# Patient Record
Sex: Male | Born: 1940
Health system: Southern US, Community
[De-identification: ages and names within clinical notes are randomized; demographics above are authoritative.]

## PROBLEM LIST (undated history)

## (undated) DIAGNOSIS — I10 Essential (primary) hypertension: Secondary | ICD-10-CM

## (undated) DIAGNOSIS — K219 Gastro-esophageal reflux disease without esophagitis: Secondary | ICD-10-CM

## (undated) DIAGNOSIS — C801 Malignant (primary) neoplasm, unspecified: Secondary | ICD-10-CM

## (undated) DIAGNOSIS — Z9289 Personal history of other medical treatment: Secondary | ICD-10-CM

## (undated) DIAGNOSIS — H269 Unspecified cataract: Secondary | ICD-10-CM

## (undated) DIAGNOSIS — T7840XA Allergy, unspecified, initial encounter: Secondary | ICD-10-CM

## (undated) DIAGNOSIS — E785 Hyperlipidemia, unspecified: Secondary | ICD-10-CM

## (undated) DIAGNOSIS — M1990 Unspecified osteoarthritis, unspecified site: Secondary | ICD-10-CM

## (undated) DIAGNOSIS — N400 Enlarged prostate without lower urinary tract symptoms: Secondary | ICD-10-CM

## (undated) HISTORY — DX: Unspecified osteoarthritis, unspecified site: M19.90

## (undated) HISTORY — DX: Benign prostatic hyperplasia without lower urinary tract symptoms: N40.0

## (undated) HISTORY — PX: EYE SURGERY: SHX253

## (undated) HISTORY — DX: Personal history of other medical treatment: Z92.89

## (undated) HISTORY — PX: OTHER SURGICAL HISTORY: SHX169

## (undated) HISTORY — DX: Hyperlipidemia, unspecified: E78.5

## (undated) HISTORY — DX: Allergy, unspecified, initial encounter: T78.40XA

## (undated) HISTORY — PX: TONSILLECTOMY: SUR1361

## (undated) HISTORY — PX: COLONOSCOPY: SHX174

## (undated) HISTORY — DX: Malignant (primary) neoplasm, unspecified: C80.1

## (undated) HISTORY — DX: Gastro-esophageal reflux disease without esophagitis: K21.9

## (undated) HISTORY — DX: Unspecified cataract: H26.9

## (undated) HISTORY — DX: Essential (primary) hypertension: I10

---

## 2004-08-01 ENCOUNTER — Ambulatory Visit: Payer: Self-pay

## 2007-04-04 ENCOUNTER — Ambulatory Visit: Payer: Self-pay | Admitting: Internal Medicine

## 2007-04-17 ENCOUNTER — Encounter: Payer: Self-pay | Admitting: Internal Medicine

## 2007-04-17 ENCOUNTER — Ambulatory Visit: Payer: Self-pay | Admitting: Internal Medicine

## 2007-04-17 DIAGNOSIS — Z860100 Personal history of colon polyps, unspecified: Secondary | ICD-10-CM | POA: Insufficient documentation

## 2007-07-02 ENCOUNTER — Encounter: Admission: RE | Admit: 2007-07-02 | Discharge: 2007-07-02 | Payer: Self-pay | Admitting: Family Medicine

## 2009-03-09 ENCOUNTER — Encounter: Payer: Self-pay | Admitting: Cardiology

## 2009-03-18 ENCOUNTER — Encounter: Payer: Self-pay | Admitting: Cardiology

## 2009-03-24 ENCOUNTER — Ambulatory Visit: Payer: Self-pay | Admitting: Cardiology

## 2009-03-24 DIAGNOSIS — E785 Hyperlipidemia, unspecified: Secondary | ICD-10-CM | POA: Insufficient documentation

## 2009-03-24 DIAGNOSIS — R0602 Shortness of breath: Secondary | ICD-10-CM | POA: Insufficient documentation

## 2009-04-20 ENCOUNTER — Ambulatory Visit: Payer: Self-pay | Admitting: Cardiology

## 2009-05-04 IMAGING — CR DG HIP W/ PELVIS BILAT
5 series · 5 of 5 positions shown · non-contrast
Comparison: None

CLINICAL DATA: Cough low back and bilateral hip pain

BILATERAL HIP WITH PELVIS

[view not recorded (1 of 5)]
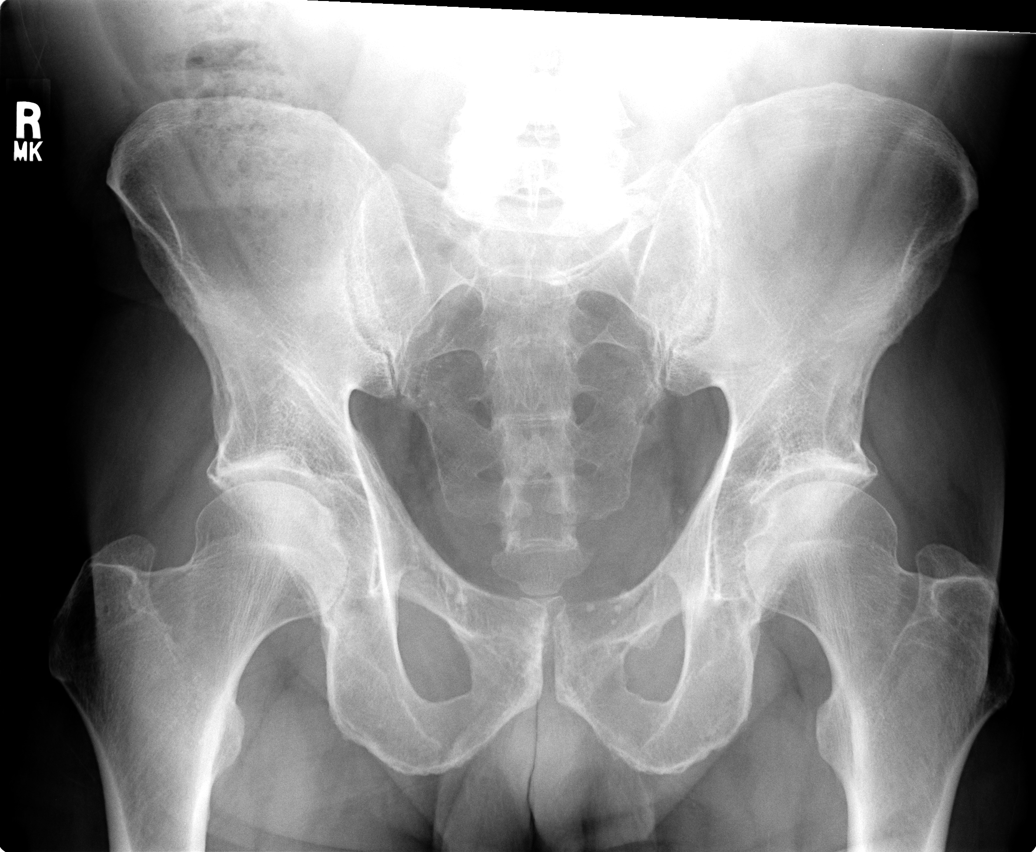

[view not recorded (2 of 5)]
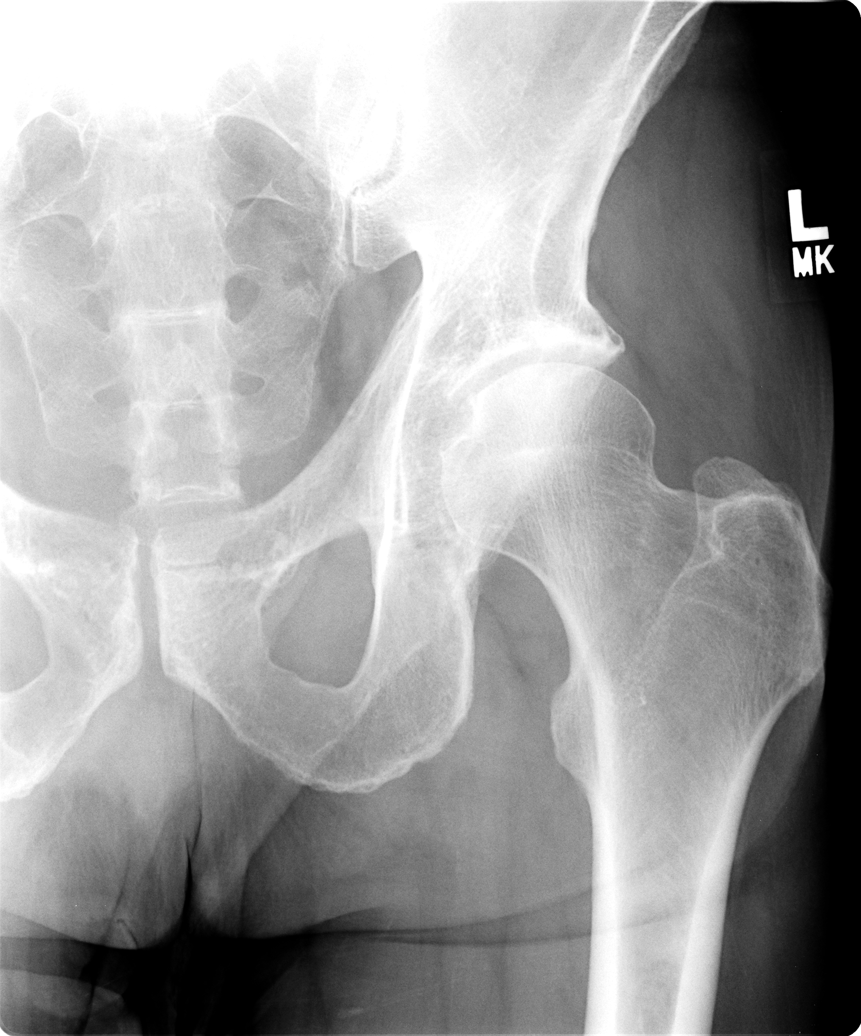

[view not recorded (3 of 5)]
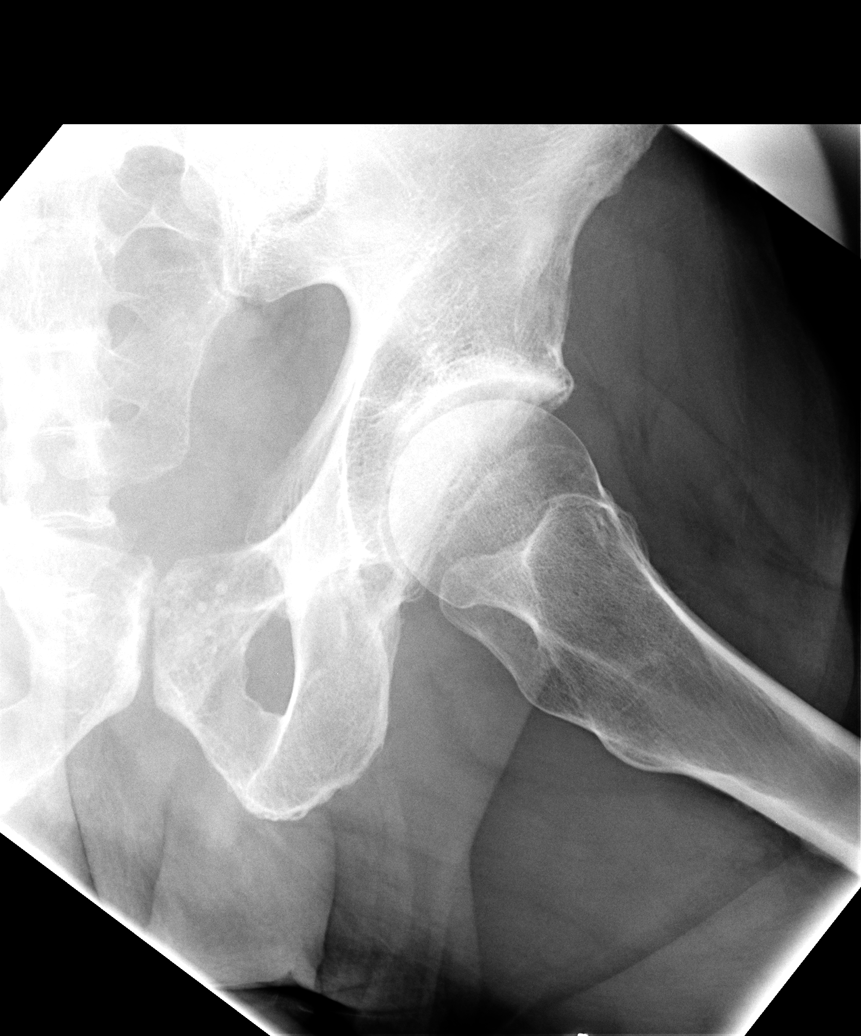

[view not recorded (4 of 5)]
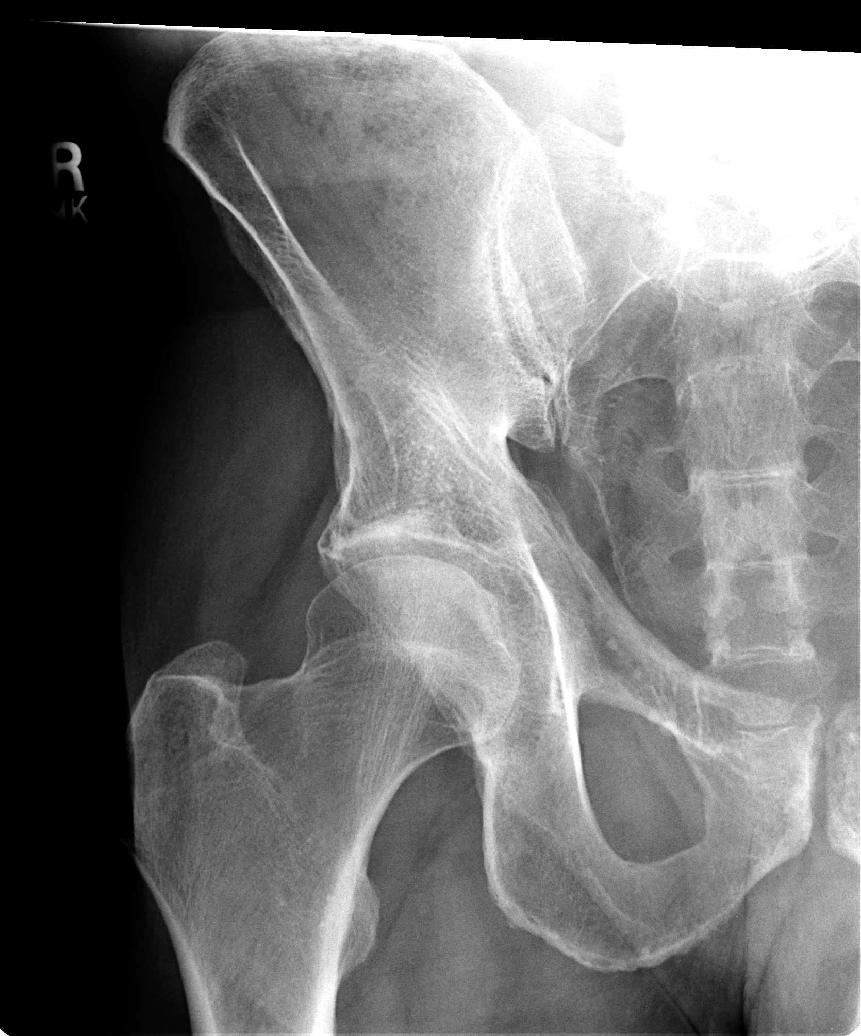

[view not recorded (5 of 5)]
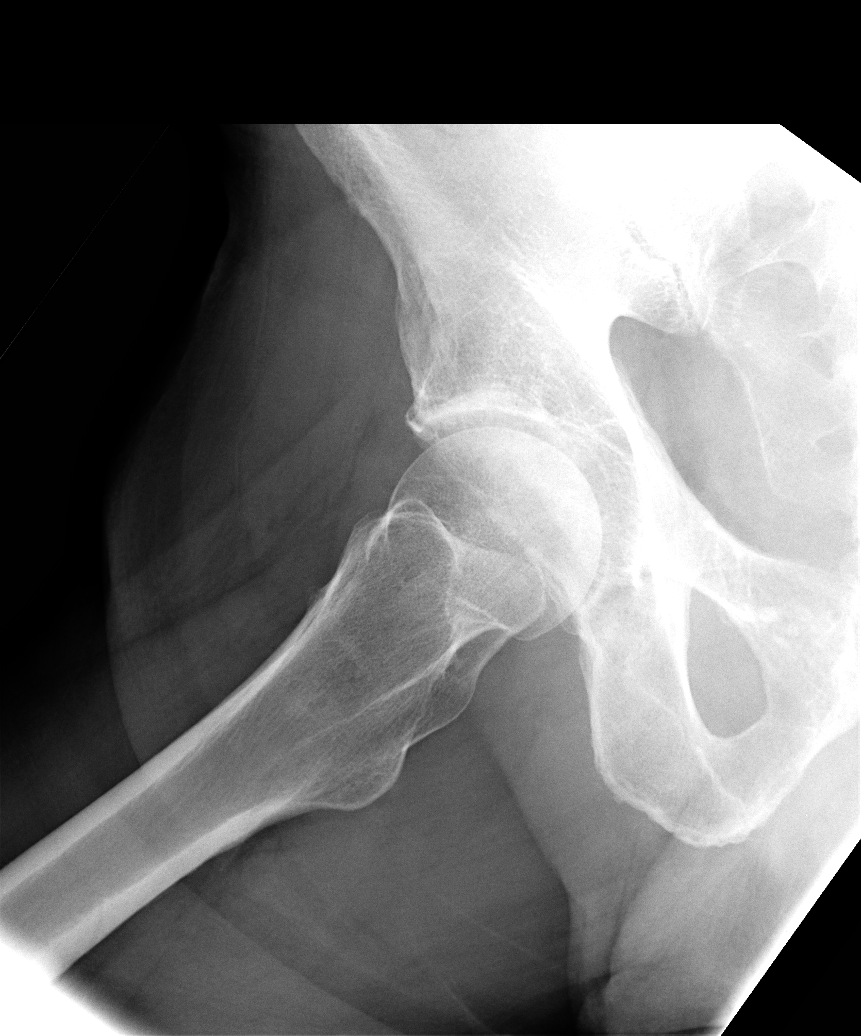

[5 of 5 positions shown; findings below may reference images not displayed]

FINDINGS: Mild degenerative changes of the hips bilaterally.
Normal mineralization.  There are no features of avascular necrosis
identified.  Degenerative disc disease lower lumbar spine.
IMPRESSION: Mild degenerative change about the hips bilaterally.

## 2009-05-04 IMAGING — CR DG CHEST 2V
2 series · 2 of 2 positions shown · non-contrast
Comparison: None

CLINICAL DATA: Cough, low back pain

CHEST - 2 VIEW

[view not recorded (1 of 2)]
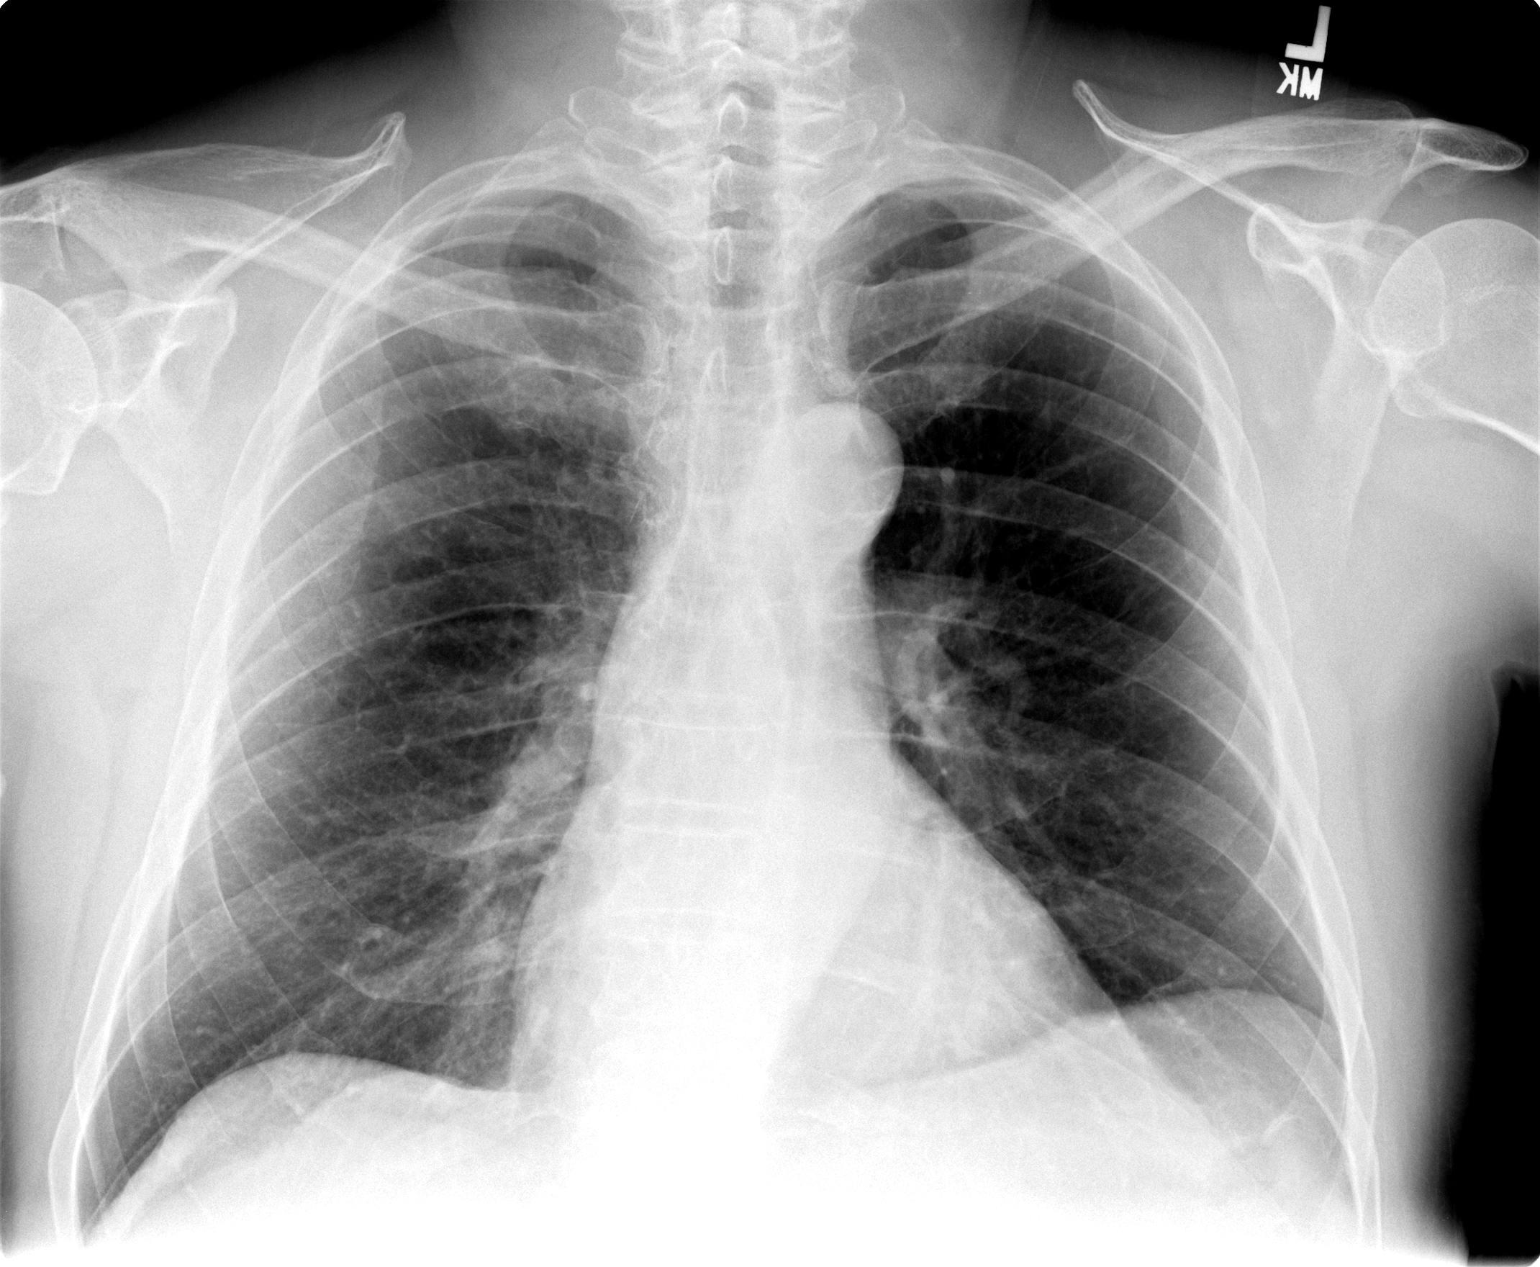

[view not recorded (2 of 2)]
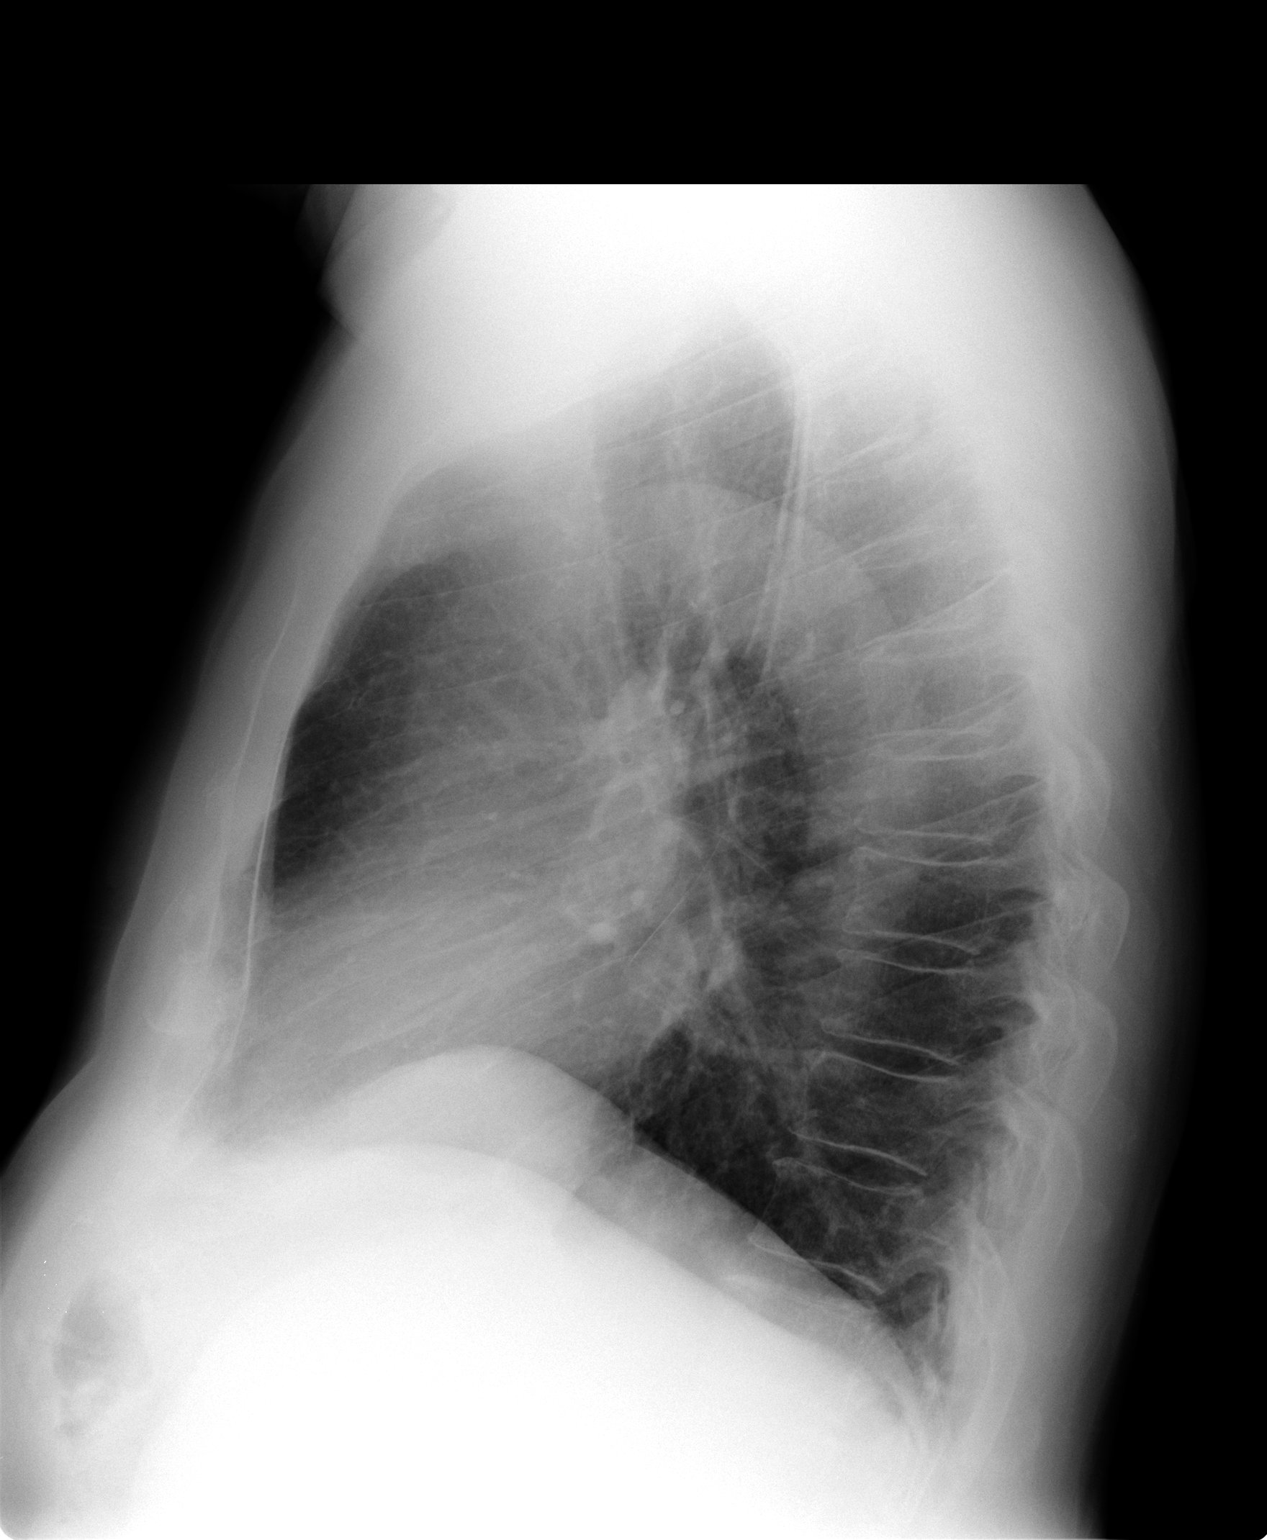

[2 of 2 positions shown; findings below may reference images not displayed]

FINDINGS: Mild peribronchial cuffing.  Negative for infiltrates or
effusions.  Heart is normal.  Mediastinum and hila are negative
adenopathy.  Mild thoracic spine degenerative changes
IMPRESSION: Bronchitis, acute versus chronic.

## 2010-02-21 NOTE — Assessment & Plan Note (Signed)
Summary: np6/family hx HD/risk factors/jss  Medications Added GLUCOSAMINE-CHONDROITIN  CAPS (GLUCOSAMINE-CHONDROIT-VIT C-MN) 2,000 mg daily MULTIVITAMINS  TABS (MULTIPLE VITAMIN) take one tablet once daily FISH OIL 1000 MG CAPS (OMEGA-3 FATTY ACIDS) two times a day COMPLETE ALLERGY 25 MG TABS (DIPHENHYDRAMINE HCL) once daily VITAMIN D3 1000 UNIT CAPS (CHOLECALCIFEROL) two times a day ASPIRIN 81 MG TABS (ASPIRIN) take one tablet once daily NIASPAN 500 MG CR-TABS (NIACIN (ANTIHYPERLIPIDEMIC)) take 1500 daily LIPITOR 40 MG TABS (ATORVASTATIN CALCIUM) take one tablet once daily ALEVE 220 MG TABS (NAPROXEN SODIUM) take as needed      Allergies Added: ! PREDNISONE  Primary Provider:  Dr. Christell Constant   History of Present Illness: 70 yo with history of hyperlipidemia and strong family history of CAD presents for cardiology evaluation.  Patient has been doing well.  He is still working as a Curator.  He has had no episodes of chest pain or exertional dyspnea.  He is active at work.  No episodes of syncope or palpitations.  Patient quit smoking in 1969.  His lipids are under good control and his BP is within normal range.  Patient's father and two brothers all had heart attacks.  His two brothers had CABG.    ECG: NSR, probably insignificant Qs in leads I and AVL.   Labs (2/11): creatinine 1.08, TSH normal, LDL 63, HDL 49  Current Medications (verified): 1)  Glucosamine-Chondroitin  Caps (Glucosamine-Chondroit-Vit C-Mn) .... 2,000 Mg Daily 2)  Multivitamins  Tabs (Multiple Vitamin) .... Take One Tablet Once Daily 3)  Fish Oil 1000 Mg Caps (Omega-3 Fatty Acids) .... Two Times A Day 4)  Complete Allergy 25 Mg Tabs (Diphenhydramine Hcl) .... Once Daily 5)  Vitamin D3 1000 Unit Caps (Cholecalciferol) .... Two Times A Day 6)  Aspirin 81 Mg Tabs (Aspirin) .... Take One Tablet Once Daily 7)  Niaspan 500 Mg Cr-Tabs (Niacin (Antihyperlipidemic)) .... Take 1500 Daily 8)  Lipitor 40 Mg Tabs (Atorvastatin  Calcium) .... Take One Tablet Once Daily 9)  Aleve 220 Mg Tabs (Naproxen Sodium) .... Take As Needed  Allergies (verified): 1)  ! Prednisone  Past History:  Past Medical History: 1.  Hyperlipidemia 2. BPH 3. ETT several years ago with Dr. Elease Hashimoto was normal per patient  Family History: 2 brothers with CAD.  One had CABG at 43 and other with CABG in his 47s. Father with MI in his 16s.   Social History: Quit tobacco in 1969.  Still working as a Curator.  LIves in [place].  Married with 2 children.   Review of Systems       All systems reviewed and negative except as per HPI.   Vital Signs:  Patient profile:   70 year old male Height:      70 inches Weight:      196 pounds BMI:     28.22 Pulse rate:   68 / minute Pulse rhythm:   regular BP sitting:   128 / 80  (left arm) Cuff size:   regular  Vitals Entered By: Judithe Modest CMA (March 24, 2009 3:48 PM)  Physical Exam  General:  Well developed, well nourished, in no acute distress. Head:  normocephalic and atraumatic Nose:  no deformity, discharge, inflammation, or lesions Mouth:  Teeth, gums and palate normal. Oral mucosa normal. Neck:  Neck supple, no JVD. No masses, thyromegaly or abnormal cervical nodes. Lungs:  Clear bilaterally to auscultation and percussion. Heart:  Non-displaced PMI, chest non-tender; regular rate and rhythm, S1, S2 without  murmurs, rubs or gallops. Carotid upstroke normal, no bruit. Pedals normal pulses. No edema, no varicosities. Abdomen:  Bowel sounds positive; abdomen soft and non-tender without masses, organomegaly, or hernias noted. No hepatosplenomegaly. Msk:  Back normal, normal gait. Muscle strength and tone normal. Extremities:  No clubbing or cyanosis. Neurologic:  Alert and oriented x 3. Skin:  Intact without lesions or rashes. Psych:  Normal affect.   Impression & Recommendations:  Problem # 1:  CARDIAC RISK ASSESSMENT Patient is doing well without chest pain or  exertional dyspnea.  His cardiac risk factors are hyperlipidemia and family history of CAD.  I will set him up for an ETT for risk stratification.    Problem # 2:  HYPERLIPIDEMIA-MIXED (ICD-272.4) Lipids are well-controlled by Dr. Christell Constant.   Other Orders: Treadmill (Treadmill)  Patient Instructions: 1)  Your physician has requested that you have an exercise tolerance test.  For further information please visit [email or web address]  2)  Wednesday March 30,2011 3PM--this will be at [facility] [employer]- [address]  3)  DO NOT eat , drink, or use tobacco products 4 hours before the test. 4)  DO  NOT  use caffeine for 12 hours before the test. 5)  You may take all your medications before the test.  6)  Wear comfortable shoes and clothes for walking. 7)  Call [phone] if you need to cancel or reschedule the appointment.

## 2010-02-21 NOTE — Consult Note (Signed)
Summary: [facility]  [facility]   Imported By: Marylou Mccoy 05/12/2009 13:18:46  _____________________________________________________________________  External Attachment:    Type:   Image     Comment:   External Document

## 2010-09-05 ENCOUNTER — Encounter: Payer: Self-pay | Admitting: Cardiology

## 2012-03-09 ENCOUNTER — Encounter: Payer: Self-pay | Admitting: Internal Medicine

## 2012-03-10 ENCOUNTER — Encounter: Payer: Self-pay | Admitting: Internal Medicine

## 2012-03-20 ENCOUNTER — Encounter: Payer: Self-pay | Admitting: Internal Medicine

## 2012-04-01 ENCOUNTER — Ambulatory Visit (AMBULATORY_SURGERY_CENTER): Payer: Medicare Other | Admitting: *Deleted

## 2012-04-01 VITALS — Ht 71.0 in | Wt 210.2 lb

## 2012-04-01 DIAGNOSIS — Z86010 Personal history of colonic polyps: Secondary | ICD-10-CM

## 2012-04-01 DIAGNOSIS — Z1211 Encounter for screening for malignant neoplasm of colon: Secondary | ICD-10-CM

## 2012-04-01 NOTE — Progress Notes (Signed)
No soy or egg allergy Suprep sample given to pt

## 2012-04-04 ENCOUNTER — Encounter: Payer: Self-pay | Admitting: Internal Medicine

## 2012-04-28 ENCOUNTER — Telehealth: Payer: Self-pay | Admitting: Internal Medicine

## 2012-04-28 NOTE — Telephone Encounter (Signed)
Patient wanted to know if he can take his usual laxatives prior to prepping for the procedure.  He takes an aloe vera pill daily.  He is advised that he is fine to take that.  He will call back for any additional questions.

## 2012-04-30 ENCOUNTER — Ambulatory Visit (AMBULATORY_SURGERY_CENTER): Payer: Medicare Other | Admitting: Internal Medicine

## 2012-04-30 ENCOUNTER — Encounter: Payer: Self-pay | Admitting: Internal Medicine

## 2012-04-30 VITALS — BP 130/84 | HR 63 | Temp 97.4°F | Resp 15 | Ht 71.0 in | Wt 210.0 lb

## 2012-04-30 DIAGNOSIS — Z86010 Personal history of colonic polyps: Secondary | ICD-10-CM

## 2012-04-30 DIAGNOSIS — Z1211 Encounter for screening for malignant neoplasm of colon: Secondary | ICD-10-CM

## 2012-04-30 DIAGNOSIS — D126 Benign neoplasm of colon, unspecified: Secondary | ICD-10-CM

## 2012-04-30 MED ORDER — SODIUM CHLORIDE 0.9 % IV SOLN: 500.0000 mL | INTRAVENOUS | Status: DC

## 2012-04-30 NOTE — Patient Instructions (Addendum)
Three tiny polyps were removed. The colon is stained "melanosis" as before. Usually from natural laxative use and not a problem. Your prostate is enlarged - I believe you know that - be sure to keep regular follow-up on that with your primary provider and/or urologist.  I will let you know pathology results and when to have another routine colonoscopy by mail.  Thank you for choosing me and [facility].  Iva Boop, MD, FACG  YOU HAD AN ENDOSCOPIC PROCEDURE TODAY AT THE [facility]: Refer to the procedure report that was given to you for any specific questions about what was found during the examination.  If the procedure report does not answer your questions, please call your gastroenterologist to clarify.  If you requested that your care partner not be given the details of your procedure findings, then the procedure report has been included in a sealed envelope for you to review at your convenience later.  YOU SHOULD EXPECT: Some feelings of bloating in the abdomen. Passage of more gas than usual.  Walking can help get rid of the air that was put into your GI tract during the procedure and reduce the bloating. If you had a lower endoscopy (such as a colonoscopy or flexible sigmoidoscopy) you may notice spotting of blood in your stool or on the toilet paper. If you underwent a bowel prep for your procedure, then you may not have a normal bowel movement for a few days.  DIET: Your first meal following the procedure should be a light meal and then it is ok to progress to your normal diet.  A half-sandwich or bowl of soup is an example of a good first meal.  Heavy or fried foods are harder to digest and may make you feel nauseous or bloated.  Likewise meals heavy in dairy and vegetables can cause extra gas to form and this can also increase the bloating.  Drink plenty of fluids but you should avoid alcoholic beverages for 24 hours.  ACTIVITY: Your care partner should take  you home directly after the procedure.  You should plan to take it easy, moving slowly for the rest of the day.  You can resume normal activity the day after the procedure however you should NOT DRIVE or use heavy machinery for 24 hours (because of the sedation medicines used during the test).    SYMPTOMS TO REPORT IMMEDIATELY: A gastroenterologist can be reached at any hour.  During normal business hours, 8:30 AM to 5:00 PM Monday through Friday, call [phone].  After hours and on weekends, please call the GI answering service at [phone] who will take a message and have the physician on call contact you.   Following lower endoscopy (colonoscopy or flexible sigmoidoscopy):  Excessive amounts of blood in the stool  Significant tenderness or worsening of abdominal pains  Swelling of the abdomen that is new, acute  Fever of 100F or higher  FOLLOW UP: If any biopsies were taken you will be contacted by phone or by letter within the next 1-3 weeks.  Call your gastroenterologist if you have not heard about the biopsies in 3 weeks.  Our staff will call the home number listed on your records the next business day following your procedure to check on you and address any questions or concerns that you may have at that time regarding the information given to you following your procedure. This is a courtesy call and so if there is no answer at the  home number and we have not heard from you through the emergency physician on call, we will assume that you have returned to your regular daily activities without incident.  SIGNATURES/CONFIDENTIALITY: You and/or your care partner have signed paperwork which will be entered into your electronic medical record.  These signatures attest to the fact that that the information above on your After Visit Summary has been reviewed and is understood.  Full responsibility of the confidentiality of this discharge information lies with you and/or your  care-partner.  Polyps-handout given  Repeat colonoscopy will be determined by by pathology

## 2012-04-30 NOTE — Progress Notes (Signed)
Lidocaine-40mg IV prior to Propofol InductionPropofol given over incremental dosages 

## 2012-04-30 NOTE — Op Note (Signed)
[facility] [address] [place], [address]   COLONOSCOPY PROCEDURE REPORT  PATIENT: Michael Nguyen, Michael Nguyen  MR#: [id number] BIRTHDATE: 06-23-1940 , 71  yrs. old GENDER: Male ENDOSCOPIST: Iva Boop, MD, [facility] PROCEDURE DATE:  04/30/2012 PROCEDURE:   Colonoscopy with biopsy ASA CLASS:   Class II INDICATIONS:Screening and surveillance,personal history of colonic polyps. MEDICATIONS: propofol (Diprivan) 200mg  IV, MAC sedation, administered by CRNA, and These medications were titrated to patient response per physician's verbal order  DESCRIPTION OF PROCEDURE:   After the risks benefits and alternatives of the procedure were thoroughly explained, informed consent was obtained.  A digital rectal exam revealed an enlarged prostate.   The LB [id number]  endoscope was introduced through the anus and advanced to the cecum, which was identified by both the appendix and ileocecal valve. No adverse events experienced.   The quality of the prep was Suprep excellent  The instrument was then slowly withdrawn as the colon was fully examined.      COLON FINDINGS: Two diminutive polypoid shaped sessile polyps were found in the ascending colon.  A polypectomy was performed with cold forceps.  The resection was complete and the polyp tissue was completely retrieved.   A diminutive polypoid shaped sessile polyp was found in the sigmoid colon.  A polypectomy was performed with cold forceps.  The resection was complete and the polyp tissue was completely retrieved.   Severe melanosis was found throughout the entire examined colon.   The colon mucosa was otherwise normal.   A right colon retroflexion was performed.  Retroflexed views revealed no abnormalities. The time to cecum=2 minutes 52 seconds. Withdrawal time=12 minutes 17 seconds.  The scope was withdrawn and the procedure completed. COMPLICATIONS: There were no complications.  ENDOSCOPIC IMPRESSION: 1.   Two  diminutive sessile polyps were found in the ascending colon; polypectomy was performed with cold forceps 2.   Diminutive sessile polyp was found in the sigmoid colon; polypectomy was performed with cold forceps 3.   Severe melanosis was found throughout the entire examined colon  4.   The colon mucosa was otherwise normal  RECOMMENDATIONS: Timing of repeat colonoscopy will be determined by pathology findings in patient w/ hx 2 diminutive adenomas 2009   eSigned:  Iva Boop, MD, [facility] 04/30/2012 9:04 AM   cc: Lynnea Ferrier, MD and The Patient

## 2012-04-30 NOTE — Progress Notes (Signed)
Patient did not experience any of the following events: a burn prior to discharge; a fall within the facility; wrong site/side/patient/procedure/implant event; or a hospital transfer or hospital admission upon discharge from the facility. (G8907) Patient did not have preoperative order for IV antibiotic SSI prophylaxis. (G8918)  

## 2012-05-01 ENCOUNTER — Telehealth: Payer: Self-pay | Admitting: *Deleted

## 2012-05-01 NOTE — Telephone Encounter (Signed)
  Follow up Call-  Call back number 04/30/2012  Post procedure Call Back phone  # [phone]  Permission to leave phone message Yes     Patient questions:  Do you have a fever, pain , or abdominal swelling? no Pain Score  0 *  Have you tolerated food without any problems? yes  Have you been able to return to your normal activities? yes  Do you have any questions about your discharge instructions: Diet   no Medications  no Follow up visit  no  Do you have questions or concerns about your Care? no  Actions: * If pain score is 4 or above: No action needed, pain <4.

## 2012-05-05 ENCOUNTER — Encounter: Payer: Self-pay | Admitting: Internal Medicine

## 2012-05-05 NOTE — Progress Notes (Signed)
Quick Note:  3 diminutive adenomas Repeat colonoscopy 5 yrs about 04/2017 ______

## 2012-09-02 ENCOUNTER — Other Ambulatory Visit: Payer: Medicare Other

## 2012-09-02 DIAGNOSIS — E785 Hyperlipidemia, unspecified: Secondary | ICD-10-CM

## 2012-09-02 DIAGNOSIS — Z79899 Other long term (current) drug therapy: Secondary | ICD-10-CM

## 2012-09-02 LAB — LIPID PANEL
LDL Cholesterol: 64 mg/dL (ref 0–99)
VLDL: 24 mg/dL (ref 0–40)

## 2012-09-02 LAB — COMPLETE METABOLIC PANEL WITH GFR
ALT: 27 U/L (ref 0–53)
AST: 24 U/L (ref 0–37)
Albumin: 4.1 g/dL (ref 3.5–5.2)
Calcium: 9.3 mg/dL (ref 8.4–10.5)
Chloride: 107 mEq/L (ref 96–112)
Potassium: 4.2 mEq/L (ref 3.5–5.3)
Total Protein: 6.4 g/dL (ref 6.0–8.3)

## 2012-09-11 ENCOUNTER — Ambulatory Visit (INDEPENDENT_AMBULATORY_CARE_PROVIDER_SITE_OTHER): Payer: Medicare Other | Admitting: Family Medicine

## 2012-09-11 ENCOUNTER — Encounter: Payer: Self-pay | Admitting: Family Medicine

## 2012-09-11 VITALS — BP 130/80 | HR 80 | Temp 97.8°F | Resp 16 | Wt 210.0 lb

## 2012-09-11 DIAGNOSIS — E785 Hyperlipidemia, unspecified: Secondary | ICD-10-CM

## 2012-09-11 DIAGNOSIS — N400 Enlarged prostate without lower urinary tract symptoms: Secondary | ICD-10-CM

## 2012-09-11 DIAGNOSIS — N182 Chronic kidney disease, stage 2 (mild): Secondary | ICD-10-CM

## 2012-09-11 MED ORDER — TAMSULOSIN HCL 0.4 MG PO CAPS: 0.4000 mg | ORAL_CAPSULE | Freq: Every day | ORAL | Status: DC

## 2012-09-11 NOTE — Progress Notes (Signed)
Subjective:    Patient ID: Michael Nguyen, male    DOB: [birth year], 72 y.o.   MRN: [id number]  HPI  Patient is here today for followup of his hyperlipidemia. He is currently taking Lipitor 40 mg by mouth daily and fish oil 2000 mg by mouth daily. He denies any myalgias or right upper quadrant pain. He denies any chest pain, shortness of breath, dyspnea on exertion.  Overall he is doing well without concerns. He does report 2 episodes of nocturia each night. He also reports weak urinary stream and lower uterine output. It is worse at night but also occurs during the day. At his physical in February he was found to have a +1 prostate without nodularity. Past Medical History  Diagnosis Date  . Hyperlipidemia   . BPH (benign prostatic hypertrophy)   . History of ETT     with dr. Elease Hashimoto was normal per patient  . Allergy   . Arthritis     "some" per pt  . Cancer     skin- basal and squamous cell CA   Current Outpatient Prescriptions on File Prior to Visit  Medication Sig Dispense Refill  . ALOE VERA PO Take by mouth daily.      . Ascorbic Acid (VITAMIN C PO) Take by mouth daily.      Marland Kitchen aspirin 81 MG tablet Take 81 mg by mouth daily.        Marland Kitchen atorvastatin (LIPITOR) 40 MG tablet Take 40 mg by mouth daily.        . Cholecalciferol (VITAMIN D3) 1000 UNITS CAPS Take 1 capsule by mouth 2 (two) times daily.        . fish oil-omega-3 fatty acids 1000 MG capsule Take 1 g by mouth 2 (two) times daily.        Marland Kitchen GLUCOSAMINE-CHONDROITIN-VIT C PO Take 2,000 mg by mouth daily.        . Multiple Vitamin (MULTIVITAMIN) tablet Take 1 tablet by mouth daily.        . naproxen sodium (ALEVE) 220 MG tablet Take 220 mg by mouth as needed.         No current facility-administered medications on file prior to visit.   Allergies  Allergen Reactions  . Prednisone     REACTION: ? intolerance per patient / Cramps   History   Social History  . Marital Status: Married    Spouse Name: N/A    Number of Children:  2  . Years of Education: N/A   Occupational History  . Mechanic    Social History Main Topics  . Smoking status: Former Smoker    Quit date: 01/23/1967  . Smokeless tobacco: Never Used  . Alcohol Use: No  . Drug Use: No  . Sexual Activity: Not on file   Other Topics Concern  . Not on file   Social History Narrative   Lives in [place]     Review of Systems  All other systems reviewed and are negative.       Objective:   Physical Exam  Vitals reviewed. Neck: Neck supple. No JVD present. No thyromegaly present.  Cardiovascular: Normal rate, regular rhythm and normal heart sounds.  Exam reveals no gallop and no friction rub.   No murmur heard. Pulmonary/Chest: Effort normal and breath sounds normal. No respiratory distress. He has no wheezes. He has no rales.  Abdominal: Soft. Bowel sounds are normal. He exhibits no distension. There is no tenderness. There is no rebound and  no guarding.  Lymphadenopathy:    He has no cervical adenopathy.   Lab on 09/02/2012  Component Date Value Range Status  . Cholesterol 09/02/2012 125  0 - 200 mg/dL Final   Comment: ATP III Classification:                                < 200        mg/dL        Desirable                               200 - 239     mg/dL        Borderline High                               >= 240        mg/dL        High                             . Triglycerides 09/02/2012 122  <150 mg/dL Final  . HDL 16/10/9602 37* >39 mg/dL Final  . Total CHOL/HDL Ratio 09/02/2012 3.4   Final  . VLDL 09/02/2012 24  0 - 40 mg/dL Final  . LDL Cholesterol 09/02/2012 64  0 - 99 mg/dL Final   Comment:                            Total Cholesterol/HDL Ratio:CHD Risk                                                 Coronary Heart Disease Risk Table                                                                 Men       Women                                   1/2 Average Risk              3.4        3.3                                        Average Risk              5.0        4.4                                    2X Average Risk              9.6  7.1                                    3X Average Risk             23.4       11.0                          Use the calculated Patient Ratio above and the CHD Risk table                           to determine the patient's CHD Risk.                          ATP III Classification (LDL):                                < 100        mg/dL         Optimal                               100 - 129     mg/dL         Near or Above Optimal                               130 - 159     mg/dL         Borderline High                               160 - 189     mg/dL         High                                > 190        mg/dL         Very High                             . Sodium 09/02/2012 141  135 - 145 mEq/L Final  . Potassium 09/02/2012 4.2  3.5 - 5.3 mEq/L Final  . Chloride 09/02/2012 107  96 - 112 mEq/L Final  . CO2 09/02/2012 26  19 - 32 mEq/L Final  . Glucose, Bld 09/02/2012 96  70 - 99 mg/dL Final  . BUN 40/98/1191 15  6 - 23 mg/dL Final  . Creat 47/82/9562 1.21  0.50 - 1.35 mg/dL Final  . Total Bilirubin 09/02/2012 0.9  0.3 - 1.2 mg/dL Final  . Alkaline Phosphatase 09/02/2012 44  39 - 117 U/L Final  . AST 09/02/2012 24  0 - 37 U/L Final  . ALT 09/02/2012 27  0 - 53 U/L Final  . Total Protein 09/02/2012 6.4  6.0 - 8.3 g/dL Final  . Albumin 13/08/6576 4.1  3.5 - 5.2 g/dL Final  . Calcium 46/96/2952 9.3  8.4 - 10.5 mg/dL Final  . GFR, Est African American 09/02/2012 69   Final  .  GFR, Est Non African American 09/02/2012 60   Final   Comment:                            The estimated GFR is a calculation valid for adults (>=38 years old)                          that uses the CKD-EPI algorithm to adjust for age and sex. It is                            not to be used for children, pregnant women, hospitalized patients,                             patients on  dialysis, or with rapidly changing kidney function.                          According to the NKDEP, eGFR >89 is normal, 60-89 shows mild                          impairment, 30-59 shows moderate impairment, 15-29 shows severe                          impairment and <15 is ESRD.                                      Assessment & Plan:  1. HYPERLIPIDEMIA-MIXED Labs look excellent except for a slightly low HDL. I have recommended he continue the fish oil and Lipitor.  Recheck levels in 6 months. I also recommended he increase his aerobic exercise to try to help address his low HDL.  2. CKD (chronic kidney disease), stage 2 (mild) Patient is taking Aleve daily for low back pain. He has a mildly elevated creatinine. I recommended that he decrease the amount of Aleve that he is taking and replace it with Tylenol.  3. BPH (benign prostatic hyperplasia) Begin Flomax 0.4 mg by mouth each bedtime and recheck in 1 month.

## 2012-10-14 ENCOUNTER — Telehealth: Payer: Self-pay | Admitting: Family Medicine

## 2012-10-14 NOTE — Telephone Encounter (Signed)
Pt has been on Flomax and he says that it is working other than he feels his head is constantly stopped up. He says that he has to keep an inhaler with him always because of this.

## 2012-10-15 ENCOUNTER — Ambulatory Visit (INDEPENDENT_AMBULATORY_CARE_PROVIDER_SITE_OTHER): Payer: Medicare Other | Admitting: Family Medicine

## 2012-10-15 DIAGNOSIS — Z23 Encounter for immunization: Secondary | ICD-10-CM

## 2012-10-16 NOTE — Telephone Encounter (Signed)
LMTRC with pt.

## 2012-10-16 NOTE — Telephone Encounter (Signed)
Try stopping flomax and see if symptoms improve.  If not, NTBS.

## 2012-10-17 NOTE — Telephone Encounter (Signed)
LMTRC with pt.

## 2012-10-20 NOTE — Telephone Encounter (Signed)
Pt still has called back.

## 2012-10-27 ENCOUNTER — Ambulatory Visit (INDEPENDENT_AMBULATORY_CARE_PROVIDER_SITE_OTHER): Payer: Medicare Other | Admitting: Family Medicine

## 2012-10-27 ENCOUNTER — Encounter: Payer: Self-pay | Admitting: Family Medicine

## 2012-10-27 DIAGNOSIS — I872 Venous insufficiency (chronic) (peripheral): Secondary | ICD-10-CM

## 2012-10-27 DIAGNOSIS — T485X1A Poisoning by other anti-common-cold drugs, accidental (unintentional), initial encounter: Secondary | ICD-10-CM

## 2012-10-27 DIAGNOSIS — T48201A Poisoning by unspecified drugs acting on muscles, accidental (unintentional), initial encounter: Secondary | ICD-10-CM

## 2012-10-27 MED ORDER — FLUTICASONE PROPIONATE 50 MCG/ACT NA SUSP: 2.0000 | Freq: Every day | NASAL | Status: DC

## 2012-10-27 NOTE — Progress Notes (Signed)
Subjective:    Patient ID: Michael Nguyen, male    DOB: 1940-03-22, 72 y.o.   MRN: [id number]  HPI Patient presents with episodic swelling in both legs. He states at the end of the day he has pitting edema in his ankles and lower legs.  However after he sleeps the night the swelling goes down.  He denies any chest pain, orthopnea, paroxysmal nocturnal dyspnea, pleurisy, or cough. His weight is stable. We recently checked a CMP that was negative for any renal or hepatic problems. He has no history of blood clots. He denies any pain in the legs. He does have numerous spider veins around the ankles and large varicose veins on his shins significant for chronic venous insufficiency.  He also has been using Afrin every day for over a month. He states that his nose still like it is completely swollen shut. He is having a difficult time breathing. He denies any fever or nasal pain or sinus pain. Past Medical History  Diagnosis Date  . Hyperlipidemia   . BPH (benign prostatic hypertrophy)   . History of ETT     with dr. Elease Hashimoto was normal per patient  . Allergy   . Arthritis     "some" per pt  . Cancer     skin- basal and squamous cell CA   Past Surgical History  Procedure Laterality Date  . Cataracts      both eyes  . Colonoscopy    . Tonsillectomy     Current Outpatient Prescriptions on File Prior to Visit  Medication Sig Dispense Refill  . ALOE VERA PO Take by mouth daily.      . Ascorbic Acid (VITAMIN C PO) Take by mouth daily.      Marland Kitchen aspirin 81 MG tablet Take 81 mg by mouth daily.        Marland Kitchen atorvastatin (LIPITOR) 40 MG tablet Take 40 mg by mouth daily.        . cetirizine (ZYRTEC) 10 MG tablet Take 10 mg by mouth daily.      . Cholecalciferol (VITAMIN D3) 1000 UNITS CAPS Take 1 capsule by mouth 2 (two) times daily.        . fish oil-omega-3 fatty acids 1000 MG capsule Take 1 g by mouth 2 (two) times daily.        Marland Kitchen GLUCOSAMINE-CHONDROITIN-VIT C PO Take 2,000 mg by mouth daily.        .  Multiple Vitamin (MULTIVITAMIN) tablet Take 1 tablet by mouth daily.        . naproxen sodium (ALEVE) 220 MG tablet Take 220 mg by mouth as needed.        . tamsulosin (FLOMAX) 0.4 MG CAPS capsule Take 1 capsule (0.4 mg total) by mouth daily.  30 capsule  3   No current facility-administered medications on file prior to visit.   Allergies  Allergen Reactions  . Prednisone     REACTION: ? intolerance per patient / Cramps   History   Social History  . Marital Status: Married    Spouse Name: N/A    Number of Children: 2  . Years of Education: N/A   Occupational History  . Mechanic    Social History Main Topics  . Smoking status: Former Smoker    Quit date: 01/23/1967  . Smokeless tobacco: Never Used  . Alcohol Use: No  . Drug Use: No  . Sexual Activity: Not on file   Other Topics Concern  . Not on  file   Social History Narrative   Lives in [place]      Review of Systems  All other systems reviewed and are negative.       Objective:   Physical Exam  Vitals reviewed. Constitutional: He appears well-developed and well-nourished.  HENT:  Nose: Nose normal.  Mouth/Throat: No oropharyngeal exudate.  Neck: Neck supple. No JVD present.  Cardiovascular: Normal rate, regular rhythm and normal heart sounds.  Exam reveals no gallop and no friction rub.   No murmur heard. Pulmonary/Chest: Effort normal and breath sounds normal. No respiratory distress. He has no wheezes. He has no rales.  Abdominal: Soft. Bowel sounds are normal. He exhibits no distension. There is no tenderness. There is no rebound and no guarding.  Musculoskeletal: Normal range of motion. He exhibits no edema.  Lymphadenopathy:    He has no cervical adenopathy.          Assessment & Plan:  1. Rhinitis medicamentosa, initial encounter Recommended discontinuation of aspirin. Start Flonase 2 sprays each nostril twice a day 2 recommended he wait 3 weeks. I anticipate his symptoms will worsen  over the next week or 2 and gradually improved as his body withdraws from Afrin.  2. Chronic venous insufficiency I recommended compression stockings that were knee-high. I recommended a low-sodium diet. I recommended elevating the legs as often as possible. I also recommended a trial of horse chestnut seed extract over-the-counter.

## 2013-01-12 ENCOUNTER — Other Ambulatory Visit: Payer: Self-pay | Admitting: Family Medicine

## 2013-02-11 ENCOUNTER — Other Ambulatory Visit: Payer: Self-pay | Admitting: Family Medicine

## 2013-02-13 ENCOUNTER — Other Ambulatory Visit: Payer: Self-pay | Admitting: Family Medicine

## 2013-05-08 ENCOUNTER — Other Ambulatory Visit: Payer: Self-pay | Admitting: Family Medicine

## 2013-05-08 MED ORDER — TAMSULOSIN HCL 0.4 MG PO CAPS: ORAL_CAPSULE | ORAL | Status: DC

## 2013-05-08 NOTE — Telephone Encounter (Signed)
Med refilled.

## 2013-06-04 ENCOUNTER — Other Ambulatory Visit: Payer: Self-pay | Admitting: Family Medicine

## 2013-09-07 ENCOUNTER — Other Ambulatory Visit: Payer: Managed Care, Other (non HMO)

## 2013-09-07 DIAGNOSIS — Z125 Encounter for screening for malignant neoplasm of prostate: Secondary | ICD-10-CM

## 2013-09-07 DIAGNOSIS — Z0000 Encounter for general adult medical examination without abnormal findings: Secondary | ICD-10-CM

## 2013-09-07 DIAGNOSIS — Z79899 Other long term (current) drug therapy: Secondary | ICD-10-CM

## 2013-09-07 DIAGNOSIS — I10 Essential (primary) hypertension: Secondary | ICD-10-CM

## 2013-09-07 LAB — CBC WITH DIFFERENTIAL/PLATELET
BASOS PCT: 1 % (ref 0–1)
Basophils Absolute: 0.1 10*3/uL (ref 0.0–0.1)
Eosinophils Absolute: 0.3 10*3/uL (ref 0.0–0.7)
Eosinophils Relative: 4 % (ref 0–5)
HEMATOCRIT: 45.3 % (ref 39.0–52.0)
Hemoglobin: 15.8 g/dL (ref 13.0–17.0)
LYMPHS PCT: 29 % (ref 12–46)
Lymphs Abs: 2.3 10*3/uL (ref 0.7–4.0)
MCH: 30.7 pg (ref 26.0–34.0)
MCHC: 34.9 g/dL (ref 30.0–36.0)
MCV: 88.1 fL (ref 78.0–100.0)
MONOS PCT: 7 % (ref 3–12)
Monocytes Absolute: 0.6 10*3/uL (ref 0.1–1.0)
NEUTROS ABS: 4.8 10*3/uL (ref 1.7–7.7)
Neutrophils Relative %: 59 % (ref 43–77)
Platelets: 195 10*3/uL (ref 150–400)
RBC: 5.14 MIL/uL (ref 4.22–5.81)
RDW: 13.9 % (ref 11.5–15.5)
WBC: 8.1 10*3/uL (ref 4.0–10.5)

## 2013-09-07 LAB — COMPREHENSIVE METABOLIC PANEL
ALBUMIN: 4.2 g/dL (ref 3.5–5.2)
ALT: 24 U/L (ref 0–53)
AST: 24 U/L (ref 0–37)
Alkaline Phosphatase: 43 U/L (ref 39–117)
BUN: 13 mg/dL (ref 6–23)
CHLORIDE: 106 meq/L (ref 96–112)
CO2: 25 mEq/L (ref 19–32)
CREATININE: 1.11 mg/dL (ref 0.50–1.35)
Calcium: 9.3 mg/dL (ref 8.4–10.5)
GLUCOSE: 93 mg/dL (ref 70–99)
POTASSIUM: 4.1 meq/L (ref 3.5–5.3)
Sodium: 141 mEq/L (ref 135–145)
Total Bilirubin: 1 mg/dL (ref 0.2–1.2)
Total Protein: 6.6 g/dL (ref 6.0–8.3)

## 2013-09-07 LAB — LIPID PANEL
CHOL/HDL RATIO: 3.5 ratio
Cholesterol: 125 mg/dL (ref 0–200)
HDL: 36 mg/dL — AB (ref >39)
LDL Cholesterol: 54 mg/dL (ref 0–99)
TRIGLYCERIDES: 176 mg/dL — AB (ref <150)
VLDL: 35 mg/dL (ref 0–40)

## 2013-09-08 LAB — PSA, MEDICARE: PSA: 1.42 ng/mL (ref <=4.00)

## 2013-09-14 ENCOUNTER — Encounter: Payer: Self-pay | Admitting: Family Medicine

## 2013-09-14 ENCOUNTER — Ambulatory Visit (INDEPENDENT_AMBULATORY_CARE_PROVIDER_SITE_OTHER): Payer: Managed Care, Other (non HMO) | Admitting: Family Medicine

## 2013-09-14 VITALS — BP 118/64 | HR 78 | Temp 97.8°F | Resp 16 | Ht 72.0 in | Wt 216.0 lb

## 2013-09-14 DIAGNOSIS — N400 Enlarged prostate without lower urinary tract symptoms: Secondary | ICD-10-CM

## 2013-09-14 DIAGNOSIS — Z23 Encounter for immunization: Secondary | ICD-10-CM

## 2013-09-14 DIAGNOSIS — Z0000 Encounter for general adult medical examination without abnormal findings: Secondary | ICD-10-CM

## 2013-09-14 MED ORDER — FINASTERIDE 5 MG PO TABS: 5.0000 mg | ORAL_TABLET | Freq: Every day | ORAL | Status: DC

## 2013-09-14 NOTE — Progress Notes (Signed)
Subjective:    Patient ID: Michael Nguyen, male    DOB: [birth year], 73 y.o.   MRN: [id number]  HPI Subjective:   Patient presents for Medicare Annual/Subsequent preventive examination.  He continues to have nocturia frequently at night. He has to awaken to go to the restroom every 2-3 hours. Flomax helps some but not sufficiently. He denies any symptoms of overactive bladder. Otherwise he is doing well with no concerns. Review Past Medical/Family/Social: Past Medical History  Diagnosis Date  . Hyperlipidemia   . BPH (benign prostatic hypertrophy)   . History of ETT     with dr. Acie Fredrickson was normal per patient  . Allergy   . Arthritis     "some" per pt  . Cancer     skin- basal and squamous cell CA   Past Surgical History  Procedure Laterality Date  . Cataracts      both eyes  . Colonoscopy    . Tonsillectomy     Current Outpatient Prescriptions on File Prior to Visit  Medication Sig Dispense Refill  . ALOE VERA PO Take by mouth daily.      . Ascorbic Acid (VITAMIN C PO) Take by mouth daily.      Marland Kitchen aspirin 81 MG tablet Take 81 mg by mouth daily.        Marland Kitchen atorvastatin (LIPITOR) 40 MG tablet Take 40 mg by mouth daily.        . cetirizine (ZYRTEC) 10 MG tablet Take 10 mg by mouth daily.      . Cholecalciferol (VITAMIN D3) 1000 UNITS CAPS Take 1 capsule by mouth 2 (two) times daily.        . fish oil-omega-3 fatty acids 1000 MG capsule Take 1 g by mouth 2 (two) times daily.        Marland Kitchen GLUCOSAMINE-CHONDROITIN-VIT C PO Take 2,000 mg by mouth daily.        . Multiple Vitamin (MULTIVITAMIN) tablet Take 1 tablet by mouth daily.        . naproxen sodium (ALEVE) 220 MG tablet Take 220 mg by mouth as needed.        . tamsulosin (FLOMAX) 0.4 MG CAPS capsule TAKE 1 CAPSULE (0.4 MG TOTAL) BY MOUTH DAILY.  30 capsule  5   No current facility-administered medications on file prior to visit.   Allergies  Allergen Reactions  . Prednisone     REACTION: ? intolerance per patient / Cramps    History   Social History  . Marital Status: Married    Spouse Name: N/A    Number of Children: 2  . Years of Education: N/A   Occupational History  . Mechanic    Social History Main Topics  . Smoking status: Former Smoker    Quit date: 01/23/1967  . Smokeless tobacco: Never Used  . Alcohol Use: No  . Drug Use: No  . Sexual Activity: Not on file   Other Topics Concern  . Not on file   Social History Narrative   Lives in [place]   Family History  Problem Relation Age of Onset  . Coronary artery disease Brother     x2. One had CABG at 11. other with CABG in his 23's. Father with MI in his 66's  . Esophageal cancer Father   . Colon cancer Neg Hx   . Rectal cancer Neg Hx   . Stomach cancer Neg Hx      Risk Factors  Current exercise habits:  Dietary  issues discussed:   Cardiac risk factors: Obesity (BMI >= 30 kg/m2).   Depression Screen  (Note: if answer to either of the following is "Yes", a more complete depression screening is indicated)  Over the past two weeks, have you felt down, depressed or hopeless? No Over the past two weeks, have you felt little interest or pleasure in doing things? No Have you lost interest or pleasure in daily life? No Do you often feel hopeless? No Do you cry easily over simple problems? No   Activities of Daily Living  In your present state of health, do you have any difficulty performing the following activities?:  Driving? No  Managing money? No  Feeding yourself? No  Getting from bed to chair? No  Climbing a flight of stairs? No  Preparing food and eating?: No  Bathing or showering? No  Getting dressed: No  Getting to the toilet? No  Using the toilet:No  Moving around from place to place: No  In the past year have you fallen or had a near fall?:No  Are you sexually active? No  Do you have more than one partner? No   Hearing Difficulties: No  Do you often ask people to speak up or repeat themselves? No  Do you  experience ringing or noises in your ears? No Do you have difficulty understanding soft or whispered voices? No  Do you feel that you have a problem with memory? No Do you often misplace items? No  Do you feel safe at home? Yes  Cognitive Testing  Alert? Yes Normal Appearance?Yes  Oriented to person? Yes Place? Yes  Time? Yes  Recall of three objects? Yes  Can perform simple calculations? Yes  Displays appropriate judgment?Yes  Can read the correct time from a watch face?Yes  Screening Tests / Date Colonoscopy   2014                  Zostavax 2008 Pneumovax 2007 prevnar today  Influenza Vaccine  Tetanus/tdap 2006 Review of Systems  All other systems reviewed and are negative.      Objective:   Physical Exam  Vitals reviewed. Constitutional: He is oriented to person, place, and time. He appears well-developed and well-nourished. No distress.  HENT:  Head: Normocephalic and atraumatic.  Right Ear: External ear normal.  Left Ear: External ear normal.  Nose: Nose normal.  Mouth/Throat: Oropharynx is clear and moist. No oropharyngeal exudate.  Eyes: Conjunctivae and EOM are normal. Pupils are equal, round, and reactive to light. Right eye exhibits no discharge. Left eye exhibits no discharge. No scleral icterus.  Neck: Normal range of motion. Neck supple. No JVD present. No tracheal deviation present. No thyromegaly present.  Cardiovascular: Normal rate, regular rhythm, normal heart sounds and intact distal pulses.  Exam reveals no gallop and no friction rub.   No murmur heard. Pulmonary/Chest: Effort normal and breath sounds normal. No stridor. No respiratory distress. He has no wheezes. He has no rales. He exhibits no tenderness.  Abdominal: Soft. Bowel sounds are normal. He exhibits no distension and no mass. There is no tenderness. There is no rebound and no guarding.  Genitourinary: Rectum normal. Prostate is enlarged.  Musculoskeletal: Normal range of motion. He exhibits  no edema and no tenderness.  Lymphadenopathy:    He has no cervical adenopathy.  Neurological: He is alert and oriented to person, place, and time. He has normal reflexes. He displays normal reflexes. No cranial nerve deficit. He exhibits normal muscle tone. Coordination  normal.  Skin: Skin is warm. No rash noted. He is not diaphoretic. No erythema. No pallor.  Psychiatric: He has a normal mood and affect. His behavior is normal. Judgment and thought content normal.          Assessment & Plan:  Need for prophylactic vaccination and inoculation against unspecified single disease - Plan: Pneumococcal conjugate vaccine 13-valent IM  BPH (benign prostatic hyperplasia) - Plan: finasteride (PROSCAR) 5 MG tablet  Routine general medical examination at a health care facility  physical exam is completely normal. Cancer screening is up to date. Prevnar 13 was administered today. Other preventive care is up to date. Lab work is excellent. I will start the patient on finasteride 5 mg by mouth daily. Recheck in 3 months. If symptoms of BPH have improved in 3 months, I will discontinue Flomax Medicare Attestation  I have personally reviewed:  The patient's medical and social history  Their use of alcohol, tobacco or illicit drugs  Their current medications and supplements  The patient's functional ability including ADLs,fall risks, home safety risks, cognitive, and hearing and visual impairment  Diet and physical activities  Evidence for depression or mood disorders  The patient's weight, height, BMI, and visual acuity have been recorded in the chart. I have made referrals, counseling, and provided education to the patient based on review of the above and I have provided the patient with a written personalized care plan for preventive services.

## 2013-10-20 ENCOUNTER — Ambulatory Visit (INDEPENDENT_AMBULATORY_CARE_PROVIDER_SITE_OTHER): Payer: Managed Care, Other (non HMO) | Admitting: *Deleted

## 2013-10-20 DIAGNOSIS — Z23 Encounter for immunization: Secondary | ICD-10-CM

## 2013-11-09 ENCOUNTER — Other Ambulatory Visit: Payer: Self-pay | Admitting: Family Medicine

## 2014-03-10 ENCOUNTER — Other Ambulatory Visit: Payer: Self-pay | Admitting: Family Medicine

## 2014-05-08 ENCOUNTER — Other Ambulatory Visit: Payer: Self-pay | Admitting: Family Medicine

## 2014-05-10 NOTE — Telephone Encounter (Signed)
Refill appropriate and filled per protocol. 

## 2014-09-08 ENCOUNTER — Other Ambulatory Visit: Payer: Self-pay | Admitting: Family Medicine

## 2014-09-23 ENCOUNTER — Other Ambulatory Visit: Payer: Medicare HMO

## 2014-09-23 DIAGNOSIS — E785 Hyperlipidemia, unspecified: Secondary | ICD-10-CM

## 2014-09-23 DIAGNOSIS — Z0000 Encounter for general adult medical examination without abnormal findings: Secondary | ICD-10-CM

## 2014-09-23 DIAGNOSIS — N400 Enlarged prostate without lower urinary tract symptoms: Secondary | ICD-10-CM

## 2014-09-23 DIAGNOSIS — Z79899 Other long term (current) drug therapy: Secondary | ICD-10-CM

## 2014-09-23 DIAGNOSIS — Z86010 Personal history of colonic polyps: Secondary | ICD-10-CM

## 2014-09-23 LAB — CBC WITH DIFFERENTIAL/PLATELET
BASOS ABS: 0.1 10*3/uL (ref 0.0–0.1)
Basophils Relative: 1 % (ref 0–1)
Eosinophils Absolute: 0.1 10*3/uL (ref 0.0–0.7)
Eosinophils Relative: 2 % (ref 0–5)
HCT: 47.4 % (ref 39.0–52.0)
Hemoglobin: 16.3 g/dL (ref 13.0–17.0)
LYMPHS PCT: 33 % (ref 12–46)
Lymphs Abs: 2.2 10*3/uL (ref 0.7–4.0)
MCH: 31.5 pg (ref 26.0–34.0)
MCHC: 34.4 g/dL (ref 30.0–36.0)
MCV: 91.5 fL (ref 78.0–100.0)
MPV: 10.7 fL (ref 8.6–12.4)
Monocytes Absolute: 0.6 10*3/uL (ref 0.1–1.0)
Monocytes Relative: 9 % (ref 3–12)
Neutro Abs: 3.6 10*3/uL (ref 1.7–7.7)
Neutrophils Relative %: 55 % (ref 43–77)
Platelets: 189 10*3/uL (ref 150–400)
RBC: 5.18 MIL/uL (ref 4.22–5.81)
RDW: 13.7 % (ref 11.5–15.5)
WBC: 6.6 10*3/uL (ref 4.0–10.5)

## 2014-09-23 LAB — COMPLETE METABOLIC PANEL WITH GFR
ALBUMIN: 4.2 g/dL (ref 3.6–5.1)
ALT: 22 U/L (ref 9–46)
AST: 23 U/L (ref 10–35)
Alkaline Phosphatase: 43 U/L (ref 40–115)
BUN: 15 mg/dL (ref 7–25)
CO2: 22 mmol/L (ref 20–31)
Calcium: 9.1 mg/dL (ref 8.6–10.3)
Chloride: 105 mmol/L (ref 98–110)
Creat: 1.11 mg/dL (ref 0.70–1.18)
GFR, EST AFRICAN AMERICAN: 75 mL/min (ref >=60)
GFR, Est Non African American: 65 mL/min (ref >=60)
Glucose, Bld: 95 mg/dL (ref 70–99)
POTASSIUM: 4.2 mmol/L (ref 3.5–5.3)
Sodium: 138 mmol/L (ref 135–146)
Total Bilirubin: 0.9 mg/dL (ref 0.2–1.2)
Total Protein: 6.7 g/dL (ref 6.1–8.1)

## 2014-09-23 LAB — LIPID PANEL
Cholesterol: 182 mg/dL (ref 125–200)
HDL: 39 mg/dL — ABNORMAL LOW (ref >=40)
LDL Cholesterol: 105 mg/dL (ref <130)
Total CHOL/HDL Ratio: 4.7 Ratio (ref <=5.0)
Triglycerides: 190 mg/dL — ABNORMAL HIGH (ref <150)
VLDL: 38 mg/dL — ABNORMAL HIGH (ref <30)

## 2014-09-23 LAB — TSH: TSH: 3.1 u[IU]/mL (ref 0.350–4.500)

## 2014-09-24 LAB — PSA, MEDICARE: PSA: 0.55 ng/mL (ref <=4.00)

## 2014-09-28 ENCOUNTER — Ambulatory Visit (INDEPENDENT_AMBULATORY_CARE_PROVIDER_SITE_OTHER): Payer: Medicare HMO | Admitting: Family Medicine

## 2014-09-28 ENCOUNTER — Encounter: Payer: Self-pay | Admitting: Family Medicine

## 2014-09-28 VITALS — BP 100/60 | HR 76 | Temp 97.8°F | Resp 16 | Ht 72.0 in | Wt 209.0 lb

## 2014-09-28 DIAGNOSIS — Z0000 Encounter for general adult medical examination without abnormal findings: Secondary | ICD-10-CM

## 2014-09-28 DIAGNOSIS — Z23 Encounter for immunization: Secondary | ICD-10-CM | POA: Diagnosis not present

## 2014-09-28 NOTE — Addendum Note (Signed)
Addended by: Shary Decamp B on: 09/28/2014 10:52 AM   Modules accepted: Orders

## 2014-09-28 NOTE — Progress Notes (Signed)
Subjective:    Patient ID: Michael Nguyen, male    DOB: 1940/09/28, 74 y.o.   MRN: [id number]  HPI Here for his CPE.  Patient continues to have urinary trouble. He states that he will awaken to urinate every 2-3 hours at night. However during the daytime he does not have this problem. He will void only 2-3 times per day and will be a normal amount. PSA has declined substantially on finasteride but his symptoms have not improved. Prostate has shrunk dramatically on digital rectal exam but his symptoms have not improved raising the question that it may not be BPH. Immunizations are up-to-date including Pneumovax 23, Prevnar 13, and the shingles vaccine. He is due for a flu shot. His most recent lab work as listed below. His last colonoscopy was in 2014 and is not due again until 2019 Lab on 09/23/2014  Component Date Value Ref Range Status  . Sodium 09/23/2014 138  135 - 146 mmol/L Final  . Potassium 09/23/2014 4.2  3.5 - 5.3 mmol/L Final  . Chloride 09/23/2014 105  98 - 110 mmol/L Final  . CO2 09/23/2014 22  20 - 31 mmol/L Final  . Glucose, Bld 09/23/2014 95  70 - 99 mg/dL Final  . BUN 09/23/2014 15  7 - 25 mg/dL Final  . Creat 09/23/2014 1.11  0.70 - 1.18 mg/dL Final  . Total Bilirubin 09/23/2014 0.9  0.2 - 1.2 mg/dL Final  . Alkaline Phosphatase 09/23/2014 43  40 - 115 U/L Final  . AST 09/23/2014 23  10 - 35 U/L Final  . ALT 09/23/2014 22  9 - 46 U/L Final  . Total Protein 09/23/2014 6.7  6.1 - 8.1 g/dL Final  . Albumin 09/23/2014 4.2  3.6 - 5.1 g/dL Final  . Calcium 09/23/2014 9.1  8.6 - 10.3 mg/dL Final  . GFR, Est African American 09/23/2014 75  >=60 mL/min Final  . GFR, Est Non African American 09/23/2014 65  >=60 mL/min Final   Comment:   The estimated GFR is a calculation valid for adults (>=72 years old) that uses the CKD-EPI algorithm to adjust for age and sex. It is   not to be used for children, pregnant women, hospitalized patients,    patients on dialysis, or with rapidly  changing kidney function. According to the NKDEP, eGFR >89 is normal, 60-89 shows mild impairment, 30-59 shows moderate impairment, 15-29 shows severe impairment and <15 is ESRD.     . TSH 09/23/2014 3.100  0.350 - 4.500 uIU/mL Final  . Cholesterol 09/23/2014 182  125 - 200 mg/dL Final  . Triglycerides 09/23/2014 190* <150 mg/dL Final  . HDL 09/23/2014 39* >=40 mg/dL Final  . Total CHOL/HDL Ratio 09/23/2014 4.7  <=5.0 Ratio Final  . VLDL 09/23/2014 38* <30 mg/dL Final  . LDL Cholesterol 09/23/2014 105  <130 mg/dL Final   Comment:   Total Cholesterol/HDL Ratio:CHD Risk                        Coronary Heart Disease Risk Table                                        Men       Women          1/2 Average Risk              3.4  3.3              Average Risk              5.0        4.4           2X Average Risk              9.6        7.1           3X Average Risk             23.4       11.0 Use the calculated Patient Ratio above and the CHD Risk table  to determine the patient's CHD Risk.   . WBC 09/23/2014 6.6  4.0 - 10.5 K/uL Final  . RBC 09/23/2014 5.18  4.22 - 5.81 MIL/uL Final  . Hemoglobin 09/23/2014 16.3  13.0 - 17.0 g/dL Final  . HCT 09/23/2014 47.4  39.0 - 52.0 % Final  . MCV 09/23/2014 91.5  78.0 - 100.0 fL Final  . MCH 09/23/2014 31.5  26.0 - 34.0 pg Final  . MCHC 09/23/2014 34.4  30.0 - 36.0 g/dL Final  . RDW 09/23/2014 13.7  11.5 - 15.5 % Final  . Platelets 09/23/2014 189  150 - 400 K/uL Final  . MPV 09/23/2014 10.7  8.6 - 12.4 fL Final  . Neutrophils Relative % 09/23/2014 55  43 - 77 % Final  . Neutro Abs 09/23/2014 3.6  1.7 - 7.7 K/uL Final  . Lymphocytes Relative 09/23/2014 33  12 - 46 % Final  . Lymphs Abs 09/23/2014 2.2  0.7 - 4.0 K/uL Final  . Monocytes Relative 09/23/2014 9  3 - 12 % Final  . Monocytes Absolute 09/23/2014 0.6  0.1 - 1.0 K/uL Final  . Eosinophils Relative 09/23/2014 2  0 - 5 % Final  . Eosinophils Absolute 09/23/2014 0.1  0.0 - 0.7 K/uL  Final  . Basophils Relative 09/23/2014 1  0 - 1 % Final  . Basophils Absolute 09/23/2014 0.1  0.0 - 0.1 K/uL Final  . Smear Review 09/23/2014 Criteria for review not met   Final  . PSA 09/23/2014 0.55  <=4.00 ng/mL Final   Comment: Test Methodology: ECLIA PSA (Electrochemiluminescence Immunoassay)   For PSA values from 2.5-4.0, particularly in younger men <26 years old, the AUA and NCCN suggest testing for % Free PSA (3515) and evaluation of the rate of increase in PSA (PSA velocity).   Footnotes:  (1) ** Please note change in unit of measure and reference range(s). **       Subjective:    Review Past Medical/Family/Social: Past Medical History  Diagnosis Date  . Hyperlipidemia   . BPH (benign prostatic hypertrophy)   . History of ETT     with dr. Acie Fredrickson was normal per patient  . Allergy   . Arthritis     "some" per pt  . Cancer     skin- basal and squamous cell CA   Past Surgical History  Procedure Laterality Date  . Cataracts      both eyes  . Colonoscopy    . Tonsillectomy     Current Outpatient Prescriptions on File Prior to Visit  Medication Sig Dispense Refill  . ALOE VERA PO Take by mouth daily.    . Ascorbic Acid (VITAMIN C PO) Take by mouth daily.    Marland Kitchen aspirin 81 MG tablet Take 81 mg by mouth daily.      Marland Kitchen  atorvastatin (LIPITOR) 80 MG tablet TAKE 1 TABLET BY MOUTH AT BEDTIME 30 tablet 5  . cetirizine (ZYRTEC) 10 MG tablet Take 10 mg by mouth daily.    . Cholecalciferol (VITAMIN D3) 1000 UNITS CAPS Take 1 capsule by mouth 2 (two) times daily.      . finasteride (PROSCAR) 5 MG tablet TAKE ONE TABLET BY MOUTH ONCE DAILY 30 tablet 11  . fish oil-omega-3 fatty acids 1000 MG capsule Take 1 g by mouth 2 (two) times daily.      Marland Kitchen GLUCOSAMINE-CHONDROITIN-VIT C PO Take 2,000 mg by mouth daily.      . Multiple Vitamin (MULTIVITAMIN) tablet Take 1 tablet by mouth daily.      . naproxen sodium (ALEVE) 220 MG tablet Take 220 mg by mouth as needed.      . ranitidine  (ZANTAC) 150 MG tablet Take 150 mg by mouth 2 (two) times daily.    . tamsulosin (FLOMAX) 0.4 MG CAPS capsule TAKE 1 CAPSULE (0.4 MG TOTAL) BY MOUTH DAILY. 30 capsule 5   No current facility-administered medications on file prior to visit.   Allergies  Allergen Reactions  . Prednisone     REACTION: ? intolerance per patient / Cramps   Social History   Social History  . Marital Status: Married    Spouse Name: N/A  . Number of Children: 2  . Years of Education: N/A   Occupational History  . Mechanic    Social History Main Topics  . Smoking status: Former Smoker    Quit date: 01/23/1967  . Smokeless tobacco: Never Used  . Alcohol Use: No  . Drug Use: No  . Sexual Activity: Not on file   Other Topics Concern  . Not on file   Social History Narrative   Lives in [place]   Family History  Problem Relation Age of Onset  . Coronary artery disease Brother     x2. One had CABG at 81. other with CABG in his 2's. Father with MI in his 18's  . Esophageal cancer Father   . Colon cancer Neg Hx   . Rectal cancer Neg Hx   . Stomach cancer Neg Hx      Risk Factors  Current exercise habits:  Dietary issues discussed:   Cardiac risk factors: Obesity (BMI >= 30 kg/m2).   Depression Screen  (Note: if answer to either of the following is "Yes", a more complete depression screening is indicated)  Over the past two weeks, have you felt down, depressed or hopeless? No Over the past two weeks, have you felt little interest or pleasure in doing things? No Have you lost interest or pleasure in daily life? No Do you often feel hopeless? No Do you cry easily over simple problems? No   Activities of Daily Living  In your present state of health, do you have any difficulty performing the following activities?:  Driving? No  Managing money? No  Feeding yourself? No  Getting from bed to chair? No  Climbing a flight of stairs? No  Preparing food and eating?: No  Bathing or  showering? No  Getting dressed: No  Getting to the toilet? No  Using the toilet:No  Moving around from place to place: No  In the past year have you fallen or had a near fall?:No  Are you sexually active? No  Do you have more than one partner? No   Hearing Difficulties: No  Do you often ask people to speak up or  repeat themselves? No  Do you experience ringing or noises in your ears? No Do you have difficulty understanding soft or whispered voices? No  Do you feel that you have a problem with memory? No Do you often misplace items? No  Do you feel safe at home? Yes  Cognitive Testing  Alert? Yes Normal Appearance?Yes  Oriented to person? Yes Place? Yes  Time? Yes  Recall of three objects? Yes  Can perform simple calculations? Yes  Displays appropriate judgment?Yes  Can read the correct time from a watch face?Yes  Screening Tests / Date Colonoscopy   2014                  Zostavax 2008 Pneumovax 2007 prevnar today  Influenza Vaccine  Tetanus/tdap 2006 Review of Systems  All other systems reviewed and are negative.      Objective:   Physical Exam  Constitutional: He is oriented to person, place, and time. He appears well-developed and well-nourished. No distress.  HENT:  Head: Normocephalic and atraumatic.  Right Ear: External ear normal.  Left Ear: External ear normal.  Nose: Nose normal.  Mouth/Throat: Oropharynx is clear and moist. No oropharyngeal exudate.  Eyes: Conjunctivae and EOM are normal. Pupils are equal, round, and reactive to light. Right eye exhibits no discharge. Left eye exhibits no discharge. No scleral icterus.  Neck: Normal range of motion. Neck supple. No JVD present. No tracheal deviation present. No thyromegaly present.  Cardiovascular: Normal rate, regular rhythm, normal heart sounds and intact distal pulses.  Exam reveals no gallop and no friction rub.   No murmur heard. Pulmonary/Chest: Effort normal and breath sounds normal. No stridor. No  respiratory distress. He has no wheezes. He has no rales. He exhibits no tenderness.  Abdominal: Soft. Bowel sounds are normal. He exhibits no distension and no mass. There is no tenderness. There is no rebound and no guarding.  Genitourinary: Rectum normal. Prostate is not enlarged and not tender.  Musculoskeletal: Normal range of motion. He exhibits no edema or tenderness.  Lymphadenopathy:    He has no cervical adenopathy.  Neurological: He is alert and oriented to person, place, and time. He has normal reflexes. No cranial nerve deficit. He exhibits normal muscle tone. Coordination normal.  Skin: Skin is warm. No rash noted. He is not diaphoretic. No erythema. No pallor.  Psychiatric: He has a normal mood and affect. His behavior is normal. Judgment and thought content normal.  Vitals reviewed.         Assessment & Plan:  Routine general medical examination at a health care facility Patient's physical exam is normal. His lab work is excellent. His immunizations are up-to-date. I did recommend a flu shot. Colonoscopy is up-to-date today. PSA is normal. Digital rectal exam is normal. I am suspicious that the patient's symptoms may be due to overactive bladder. I'll try him on Vesicare 10 mg by mouth daily. His symptoms improve, I will likely discontinue his Flomax and his finasteride.  Medicare Attestation  I have personally reviewed:  The patient's medical and social history  Their use of alcohol, tobacco or illicit drugs  Their current medications and supplements  The patient's functional ability including ADLs,fall risks, home safety risks, cognitive, and hearing and visual impairment  Diet and physical activities  Evidence for depression or mood disorders  The patient's weight, height, BMI, and visual acuity have been recorded in the chart. I have made referrals, counseling, and provided education to the patient based on review of  the above and I have provided the patient with a  written personalized care plan for preventive services.

## 2014-10-13 ENCOUNTER — Telehealth: Payer: Self-pay | Admitting: Family Medicine

## 2014-10-13 DIAGNOSIS — N3281 Overactive bladder: Secondary | ICD-10-CM

## 2014-10-13 NOTE — Telephone Encounter (Signed)
Pt took some samples home 2 weeks ago to see if it would help his frequent urination. He states that it is not helping him. Please call [phone]

## 2014-10-14 NOTE — Telephone Encounter (Signed)
Pt aware and would like a referral to urology. Referral placed.

## 2014-10-14 NOTE — Telephone Encounter (Signed)
Stop med and proceed with urology referral.  Other option would be to try myrbetriq 25 poqday.

## 2014-10-22 ENCOUNTER — Telehealth: Payer: Self-pay | Admitting: *Deleted

## 2014-10-22 NOTE — Telephone Encounter (Signed)
pt has appt at [facility] on 11/25/14 with Dr. Alan Mulder at 12:45pm, lmtrc to pt for appt

## 2014-10-22 NOTE — Telephone Encounter (Signed)
Pt aware of appt.

## 2014-11-05 ENCOUNTER — Other Ambulatory Visit: Payer: Self-pay | Admitting: Family Medicine

## 2014-11-05 NOTE — Telephone Encounter (Signed)
Refill appropriate and filled per protocol. 

## 2014-12-05 ENCOUNTER — Other Ambulatory Visit: Payer: Self-pay | Admitting: Family Medicine

## 2014-12-06 ENCOUNTER — Encounter (INDEPENDENT_AMBULATORY_CARE_PROVIDER_SITE_OTHER): Payer: Medicare HMO | Admitting: Ophthalmology

## 2014-12-06 DIAGNOSIS — H33301 Unspecified retinal break, right eye: Secondary | ICD-10-CM | POA: Diagnosis not present

## 2014-12-06 DIAGNOSIS — H538 Other visual disturbances: Secondary | ICD-10-CM

## 2014-12-06 DIAGNOSIS — H26493 Other secondary cataract, bilateral: Secondary | ICD-10-CM | POA: Diagnosis not present

## 2014-12-06 DIAGNOSIS — H43813 Vitreous degeneration, bilateral: Secondary | ICD-10-CM

## 2014-12-06 DIAGNOSIS — H1851 Endothelial corneal dystrophy: Secondary | ICD-10-CM

## 2014-12-14 ENCOUNTER — Ambulatory Visit (INDEPENDENT_AMBULATORY_CARE_PROVIDER_SITE_OTHER): Payer: Medicare HMO | Admitting: Ophthalmology

## 2014-12-14 DIAGNOSIS — H33301 Unspecified retinal break, right eye: Secondary | ICD-10-CM

## 2015-02-02 ENCOUNTER — Other Ambulatory Visit: Payer: Self-pay | Admitting: Family Medicine

## 2015-04-13 ENCOUNTER — Ambulatory Visit (INDEPENDENT_AMBULATORY_CARE_PROVIDER_SITE_OTHER): Payer: Medicare HMO | Admitting: Ophthalmology

## 2015-04-13 DIAGNOSIS — H43813 Vitreous degeneration, bilateral: Secondary | ICD-10-CM | POA: Diagnosis not present

## 2015-04-13 DIAGNOSIS — H33301 Unspecified retinal break, right eye: Secondary | ICD-10-CM

## 2015-04-13 DIAGNOSIS — H1851 Endothelial corneal dystrophy: Secondary | ICD-10-CM

## 2015-09-05 ENCOUNTER — Other Ambulatory Visit: Payer: Self-pay | Admitting: Family Medicine

## 2015-09-05 ENCOUNTER — Encounter: Payer: Self-pay | Admitting: Family Medicine

## 2015-09-05 ENCOUNTER — Ambulatory Visit (INDEPENDENT_AMBULATORY_CARE_PROVIDER_SITE_OTHER): Payer: Medicare HMO | Admitting: Family Medicine

## 2015-09-05 VITALS — BP 110/78 | HR 60 | Temp 97.9°F | Resp 16 | Ht 72.0 in | Wt 217.0 lb

## 2015-09-05 DIAGNOSIS — R55 Syncope and collapse: Secondary | ICD-10-CM | POA: Diagnosis not present

## 2015-09-06 LAB — CBC WITH DIFFERENTIAL/PLATELET
BASOS ABS: 0 {cells}/uL (ref 0–200)
Basophils Relative: 0 %
EOS ABS: 470 {cells}/uL (ref 15–500)
EOS PCT: 5 %
HCT: 46.4 % (ref 38.5–50.0)
Hemoglobin: 15.8 g/dL (ref 13.0–17.0)
Lymphs Abs: 2914 cells/uL (ref 850–3900)
MCH: 30.7 pg (ref 27.0–33.0)
MCHC: 34.1 g/dL (ref 32.0–36.0)
MCV: 90.1 fL (ref 80.0–100.0)
MONO ABS: 564 {cells}/uL (ref 200–950)
MONOS PCT: 6 %
MPV: 11.3 fL (ref 7.5–12.5)
NEUTROS PCT: 58 %
Neutro Abs: 5452 cells/uL (ref 1500–7800)
PLATELETS: 199 10*3/uL (ref 140–400)
RBC: 5.15 MIL/uL (ref 4.20–5.80)
RDW: 13.7 % (ref 11.0–15.0)
WBC: 9.4 10*3/uL (ref 3.8–10.8)

## 2015-09-06 LAB — BASIC METABOLIC PANEL WITH GFR
BUN: 17 mg/dL (ref 7–25)
CHLORIDE: 108 mmol/L (ref 98–110)
CO2: 24 mmol/L (ref 20–31)
Calcium: 9.6 mg/dL (ref 8.6–10.3)
Creat: 1.01 mg/dL (ref 0.70–1.18)
GFR, EST NON AFRICAN AMERICAN: 73 mL/min (ref >=60)
GFR, Est African American: 84 mL/min (ref >=60)
Glucose, Bld: 104 mg/dL — ABNORMAL HIGH (ref 70–99)
POTASSIUM: 3.8 mmol/L (ref 3.5–5.3)
SODIUM: 140 mmol/L (ref 135–146)

## 2015-09-06 NOTE — Progress Notes (Signed)
Subjective:    Patient ID: Michael Nguyen, male    DOB: 04-27-1940, 75 y.o.   MRN: [id number]  HPI Over the last week, the patient has had 2 episodes where he felt like he was going to pass out. In both cases, the patient became extremely lightheaded when he bent over. Symptoms lasted approximately 1 minute. There was no loss of consciousness. Symptoms seem orthostatic in nature. He denies any palpitations. He denies any chest pain. He denies any shortness of breath. He denies any dyspnea on exertion. He is no longer taking Flomax. His blood pressure at home is usually higher than it is today in the office. He denies any blood loss. He denies any obvious symptoms of dehydration. Past Medical History:  Diagnosis Date  . Allergy   . Arthritis    "some" per pt  . BPH (benign prostatic hypertrophy)   . Cancer ([place])    skin- basal and squamous cell CA  . History of ETT    with dr. Acie Fredrickson was normal per patient  . Hyperlipidemia    Past Surgical History:  Procedure Laterality Date  . cataracts     both eyes  . COLONOSCOPY    . TONSILLECTOMY     Current Outpatient Prescriptions on File Prior to Visit  Medication Sig Dispense Refill  . ALOE VERA PO Take by mouth daily.    . Ascorbic Acid (VITAMIN C PO) Take by mouth daily.    Marland Kitchen aspirin 81 MG tablet Take 81 mg by mouth daily.      Marland Kitchen atorvastatin (LIPITOR) 80 MG tablet TAKE ONE TABLET BY MOUTH DAILY AT BEDTIME 30 tablet 5  . cetirizine (ZYRTEC) 10 MG tablet Take 10 mg by mouth daily.    . Cholecalciferol (VITAMIN D3) 1000 UNITS CAPS Take 1 capsule by mouth 2 (two) times daily.      . fish oil-omega-3 fatty acids 1000 MG capsule Take 1 g by mouth 2 (two) times daily.      . fluticasone (FLONASE) 50 MCG/ACT nasal spray Place 2 sprays into both nostrils daily.    Marland Kitchen GLUCOSAMINE-CHONDROITIN-VIT C PO Take 2,000 mg by mouth daily.      . Multiple Vitamin (MULTIVITAMIN) tablet Take 1 tablet by mouth daily.      . naproxen sodium (ALEVE) 220 MG  tablet Take 220 mg by mouth as needed.      . ranitidine (ZANTAC) 150 MG tablet Take 150 mg by mouth 2 (two) times daily.    . tamsulosin (FLOMAX) 0.4 MG CAPS capsule TAKE ONE CAPSULE BY MOUTH ONCE DAILY (Patient not taking: Reported on 09/05/2015) 30 capsule 11   No current facility-administered medications on file prior to visit.    Allergies  Allergen Reactions  . Prednisone     REACTION: ? intolerance per patient / Cramps   Social History   Social History  . Marital status: Married    Spouse name: N/A  . Number of children: 2  . Years of education: N/A   Occupational History  . Mechanic    Social History Main Topics  . Smoking status: Former Smoker    Quit date: 01/23/1967  . Smokeless tobacco: Never Used  . Alcohol use No  . Drug use: No  . Sexual activity: Not on file   Other Topics Concern  . Not on file   Social History Narrative   Lives in [place]  Cardiovascular: Negative for chest pain,  palpitations and leg swelling.  Neurological: Positive for light-headedness. Negative for dizziness, tremors, seizures, syncope, facial asymmetry, speech difficulty, weakness, numbness and headaches.  All other systems reviewed and are negative.      Objective:   Physical Exam  Constitutional: He is oriented to person, place, and time. He appears well-developed and well-nourished. No distress.  HENT:  Head: Normocephalic and atraumatic.  Right Ear: External ear normal.  Left Ear: External ear normal.  Mouth/Throat: Oropharynx is clear and moist. No oropharyngeal exudate.  Eyes: Conjunctivae are normal.  Neck: Neck supple. No JVD present. No thyromegaly present.  Cardiovascular: Normal rate, regular rhythm, normal heart sounds and intact distal pulses.  Exam reveals no gallop and no friction rub.   No murmur heard. Pulses:      Carotid pulses are 2+ on the right side, and 2+ on the left side. Pulmonary/Chest: Effort normal and breath  sounds normal. No respiratory distress. He has no wheezes. He has no rales. He exhibits no tenderness.  Abdominal: Soft. Bowel sounds are normal. He exhibits no distension. There is no tenderness. There is no rebound and no guarding.  Lymphadenopathy:    He has no cervical adenopathy.  Neurological: He is alert and oriented to person, place, and time. He has normal reflexes. No cranial nerve deficit. He exhibits normal muscle tone. Coordination normal.  Skin: He is not diaphoretic.   No bruit       Assessment & Plan:  Near syncope - Plan: CBC with Differential/Platelet, BASIC METABOLIC PANEL WITH GFR  Symptoms sound like orthostatic hypotension. Exam reveals no evidence of a murmur or carotid bruit. Patient denies any palpitations or tachycardia. I will check a CBC to rule out anemia. I will check a CMP to rule out evidence of prerenal azotemia/dehydration. If labs are normal, I encouraged the patient to drink more fluids over the next week and to monitor him clinically. Should symptoms persist, I will proceed with an echocardiogram of the heart, and a Holter monitor however I feel cardiomyopathy, severe valvular dysfunction, cardiac arrhythmia, are unlikely based on his symptoms and his exam

## 2015-09-07 ENCOUNTER — Encounter: Payer: Self-pay | Admitting: Family Medicine

## 2015-09-28 ENCOUNTER — Other Ambulatory Visit: Payer: Medicare HMO

## 2015-09-28 ENCOUNTER — Other Ambulatory Visit: Payer: Self-pay | Admitting: Family Medicine

## 2015-09-28 DIAGNOSIS — Z79899 Other long term (current) drug therapy: Secondary | ICD-10-CM

## 2015-09-28 DIAGNOSIS — N400 Enlarged prostate without lower urinary tract symptoms: Secondary | ICD-10-CM

## 2015-09-28 DIAGNOSIS — E785 Hyperlipidemia, unspecified: Secondary | ICD-10-CM

## 2015-09-28 DIAGNOSIS — Z0000 Encounter for general adult medical examination without abnormal findings: Secondary | ICD-10-CM

## 2015-09-28 LAB — HEPATIC FUNCTION PANEL
ALT: 24 U/L (ref 9–46)
AST: 21 U/L (ref 10–35)
Albumin: 4.5 g/dL (ref 3.6–5.1)
Alkaline Phosphatase: 41 U/L (ref 40–115)
BILIRUBIN INDIRECT: 0.7 mg/dL (ref 0.2–1.2)
Bilirubin, Direct: 0.1 mg/dL (ref <=0.2)
TOTAL PROTEIN: 7 g/dL (ref 6.1–8.1)
Total Bilirubin: 0.8 mg/dL (ref 0.2–1.2)

## 2015-09-28 LAB — TSH: TSH: 2.9 mIU/L (ref 0.40–4.50)

## 2015-09-28 LAB — LIPID PANEL
Cholesterol: 139 mg/dL (ref 125–200)
HDL: 43 mg/dL (ref >=40)
LDL CALC: 50 mg/dL (ref <130)
TRIGLYCERIDES: 232 mg/dL — AB (ref <150)
Total CHOL/HDL Ratio: 3.2 Ratio (ref <=5.0)
VLDL: 46 mg/dL — AB (ref <30)

## 2015-10-07 ENCOUNTER — Ambulatory Visit (INDEPENDENT_AMBULATORY_CARE_PROVIDER_SITE_OTHER): Payer: Medicare HMO | Admitting: Family Medicine

## 2015-10-07 ENCOUNTER — Encounter: Payer: Self-pay | Admitting: Family Medicine

## 2015-10-07 VITALS — BP 126/82 | HR 80 | Temp 97.7°F | Resp 16 | Ht 72.0 in | Wt 212.0 lb

## 2015-10-07 DIAGNOSIS — Z0000 Encounter for general adult medical examination without abnormal findings: Secondary | ICD-10-CM

## 2015-10-07 NOTE — Progress Notes (Signed)
Subjective:    Patient ID: Michael Nguyen, male    DOB: [birth year], 75 y.o.   MRN: [id number]  HPI  Here for his CPE.  Patient's OAB is much better after peripheral nerve stimulation performed at urology.  mmunizations are up-to-date including Pneumovax 23, Prevnar 13, and the shingles vaccine. He is due for a flu shot. His most recent lab work as listed below. His last colonoscopy was in 2014 and is not due again until 2019 Appointment on 09/28/2015  Component Date Value Ref Range Status  . TSH 09/28/2015 2.90  0.40 - 4.50 mIU/L Final  . Cholesterol 09/28/2015 139  125 - 200 mg/dL Final  . Triglycerides 09/28/2015 232* <150 mg/dL Final  . HDL 09/28/2015 43  >=40 mg/dL Final  . Total CHOL/HDL Ratio 09/28/2015 3.2  <=5.0 Ratio Final  . VLDL 09/28/2015 46* <30 mg/dL Final  . LDL Cholesterol 09/28/2015 50  <130 mg/dL Final   Comment:   Total Cholesterol/HDL Ratio:CHD Risk                        Coronary Heart Disease Risk Table                                        Men       Women          1/2 Average Risk              3.4        3.3              Average Risk              5.0        4.4           2X Average Risk              9.6        7.1           3X Average Risk             23.4       11.0 Use the calculated Patient Ratio above and the CHD Risk table  to determine the patient's CHD Risk.   . Total Bilirubin 09/28/2015 0.8  0.2 - 1.2 mg/dL Final  . Bilirubin, Direct 09/28/2015 0.1  <=0.2 mg/dL Final  . Indirect Bilirubin 09/28/2015 0.7  0.2 - 1.2 mg/dL Final  . Alkaline Phosphatase 09/28/2015 41  40 - 115 U/L Final  . AST 09/28/2015 21  10 - 35 U/L Final  . ALT 09/28/2015 24  9 - 46 U/L Final  . Total Protein 09/28/2015 7.0  6.1 - 8.1 g/dL Final  . Albumin 09/28/2015 4.5  3.6 - 5.1 g/dL Final    Subjective:    Review Past Medical/Family/Social: Past Medical History:  Diagnosis Date  . Allergy   . Arthritis    "some" per pt  . BPH (benign prostatic hypertrophy)   .  Cancer ([place])    skin- basal and squamous cell CA  . History of ETT    with dr. Acie Fredrickson was normal per patient  . Hyperlipidemia    Past Surgical History:  Procedure Laterality Date  . cataracts     both eyes  . COLONOSCOPY    . TONSILLECTOMY     Current Outpatient Prescriptions on File Prior to  Visit  Medication Sig Dispense Refill  . ALOE VERA PO Take by mouth daily.    . Ascorbic Acid (VITAMIN C PO) Take by mouth daily.    Marland Kitchen aspirin 81 MG tablet Take 81 mg by mouth daily.      Marland Kitchen atorvastatin (LIPITOR) 80 MG tablet TAKE ONE TABLET BY MOUTH DAILY AT BEDTIME 30 tablet 5  . cetirizine (ZYRTEC) 10 MG tablet Take 10 mg by mouth daily.    . Cholecalciferol (VITAMIN D3) 1000 UNITS CAPS Take 1 capsule by mouth 2 (two) times daily.      . fish oil-omega-3 fatty acids 1000 MG capsule Take 1 g by mouth 2 (two) times daily.      . fluticasone (FLONASE) 50 MCG/ACT nasal spray Place 2 sprays into both nostrils daily.    Marland Kitchen GLUCOSAMINE-CHONDROITIN-VIT C PO Take 2,000 mg by mouth daily.      . Multiple Vitamin (MULTIVITAMIN) tablet Take 1 tablet by mouth daily.      . naproxen sodium (ALEVE) 220 MG tablet Take 220 mg by mouth as needed.       No current facility-administered medications on file prior to visit.    Allergies  Allergen Reactions  . Prednisone     REACTION: ? intolerance per patient / Cramps   Social History   Social History  . Marital status: Married    Spouse name: N/A  . Number of children: 2  . Years of education: N/A   Occupational History  . Mechanic    Social History Main Topics  . Smoking status: Former Smoker    Quit date: 01/23/1967  . Smokeless tobacco: Never Used  . Alcohol use No  . Drug use: No  . Sexual activity: Not on file   Other Topics Concern  . Not on file   Social History Narrative   Lives in [place]   Family History  Problem Relation Age of Onset  . Coronary artery disease Brother     x2. One had CABG at 8. other with CABG in his  44's. Father with MI in his 81's  . Esophageal cancer Father   . Colon cancer Neg Hx   . Rectal cancer Neg Hx   . Stomach cancer Neg Hx      Risk Factors  Current exercise habits:  Dietary issues discussed:   Cardiac risk factors: Obesity (BMI >= 30 kg/m2).   Depression Screen  (Note: if answer to either of the following is "Yes", a more complete depression screening is indicated)  Over the past two weeks, have you felt down, depressed or hopeless? No Over the past two weeks, have you felt little interest or pleasure in doing things? No Have you lost interest or pleasure in daily life? No Do you often feel hopeless? No Do you cry easily over simple problems? No   Activities of Daily Living  In your present state of health, do you have any difficulty performing the following activities?:  Driving? No  Managing money? No  Feeding yourself? No  Getting from bed to chair? No  Climbing a flight of stairs? No  Preparing food and eating?: No  Bathing or showering? No  Getting dressed: No  Getting to the toilet? No  Using the toilet:No  Moving around from place to place: No  In the past year have you fallen or had a near fall?:No  Are you sexually active? No  Do you have more than one partner? No   Hearing Difficulties:  No  Do you often ask people to speak up or repeat themselves? No  Do you experience ringing or noises in your ears? No Do you have difficulty understanding soft or whispered voices? No  Do you feel that you have a problem with memory? No Do you often misplace items? No  Do you feel safe at home? Yes  Cognitive Testing  Alert? Yes Normal Appearance?Yes  Oriented to person? Yes Place? Yes  Time? Yes  Recall of three objects? Yes  Can perform simple calculations? Yes  Displays appropriate judgment?Yes  Can read the correct time from a watch face?Yes  Screening Tests / Date Colonoscopy   2014                  Zostavax 2008 Pneumovax 2007 prevnar today    Influenza Vaccine  Tetanus/tdap 2006 Review of Systems  All other systems reviewed and are negative.      Objective:   Physical Exam  Constitutional: He is oriented to person, place, and time. He appears well-developed and well-nourished. No distress.  HENT:  Head: Normocephalic and atraumatic.  Right Ear: External ear normal.  Left Ear: External ear normal.  Nose: Nose normal.  Mouth/Throat: Oropharynx is clear and moist. No oropharyngeal exudate.  Eyes: Conjunctivae and EOM are normal. Pupils are equal, round, and reactive to light. Right eye exhibits no discharge. Left eye exhibits no discharge. No scleral icterus.  Neck: Normal range of motion. Neck supple. No JVD present. No tracheal deviation present. No thyromegaly present.  Cardiovascular: Normal rate, regular rhythm, normal heart sounds and intact distal pulses.  Exam reveals no gallop and no friction rub.   No murmur heard. Pulmonary/Chest: Effort normal and breath sounds normal. No stridor. No respiratory distress. He has no wheezes. He has no rales. He exhibits no tenderness.  Abdominal: Soft. Bowel sounds are normal. He exhibits no distension and no mass. There is no tenderness. There is no rebound and no guarding.  Genitourinary: Rectum normal. Prostate is not enlarged and not tender.  Musculoskeletal: Normal range of motion. He exhibits no edema or tenderness.  Lymphadenopathy:    He has no cervical adenopathy.  Neurological: He is alert and oriented to person, place, and time. He has normal reflexes. No cranial nerve deficit. He exhibits normal muscle tone. Coordination normal.  Skin: Skin is warm. No rash noted. He is not diaphoretic. No erythema. No pallor.  Psychiatric: He has a normal mood and affect. His behavior is normal. Judgment and thought content normal.  Vitals reviewed.         Assessment & Plan:  Routine general medical examination at a health care facility Patient's physical exam is normal. His  lab work is excellent. His immunizations are up-to-date. I did recommend a flu shot. Colonoscopy is up-to-date today.  Digital rectal exam is normal. Check PSA the next time he has lab work  [employer]  I have personally reviewed:  The patient's medical and social history  Their use of alcohol, tobacco or illicit drugs  Their current medications and supplements  The patient's functional ability including ADLs,fall risks, home safety risks, cognitive, and hearing and visual impairment  Diet and physical activities  Evidence for depression or mood disorders  The patient's weight, height, BMI, and visual acuity have been recorded in the chart. I have made referrals, counseling, and provided education to the patient based on review of the above and I have provided the patient with a written personalized care plan for  preventive services.  Patient ID: Michael Nguyen, male   DOB: April 21, 1940, 75 y.o.   MRN: [id number]

## 2015-11-07 ENCOUNTER — Other Ambulatory Visit: Payer: Self-pay | Admitting: Family Medicine

## 2015-11-07 MED ORDER — ATORVASTATIN CALCIUM 40 MG PO TABS: 40.0000 mg | ORAL_TABLET | Freq: Every day | ORAL | 3 refills | Status: DC

## 2016-01-26 DIAGNOSIS — R350 Frequency of micturition: Secondary | ICD-10-CM | POA: Diagnosis not present

## 2016-02-16 DIAGNOSIS — R350 Frequency of micturition: Secondary | ICD-10-CM | POA: Diagnosis not present

## 2016-03-15 DIAGNOSIS — R350 Frequency of micturition: Secondary | ICD-10-CM | POA: Diagnosis not present

## 2016-04-19 DIAGNOSIS — R350 Frequency of micturition: Secondary | ICD-10-CM | POA: Diagnosis not present

## 2016-04-24 DIAGNOSIS — Z961 Presence of intraocular lens: Secondary | ICD-10-CM | POA: Diagnosis not present

## 2016-04-24 DIAGNOSIS — H52223 Regular astigmatism, bilateral: Secondary | ICD-10-CM | POA: Diagnosis not present

## 2016-04-24 DIAGNOSIS — H524 Presbyopia: Secondary | ICD-10-CM | POA: Diagnosis not present

## 2016-05-04 DIAGNOSIS — L570 Actinic keratosis: Secondary | ICD-10-CM | POA: Diagnosis not present

## 2016-05-04 DIAGNOSIS — D2271 Melanocytic nevi of right lower limb, including hip: Secondary | ICD-10-CM | POA: Diagnosis not present

## 2016-05-04 DIAGNOSIS — C44319 Basal cell carcinoma of skin of other parts of face: Secondary | ICD-10-CM | POA: Diagnosis not present

## 2016-05-04 DIAGNOSIS — L821 Other seborrheic keratosis: Secondary | ICD-10-CM | POA: Diagnosis not present

## 2016-05-04 DIAGNOSIS — D225 Melanocytic nevi of trunk: Secondary | ICD-10-CM | POA: Diagnosis not present

## 2016-05-04 DIAGNOSIS — D1801 Hemangioma of skin and subcutaneous tissue: Secondary | ICD-10-CM | POA: Diagnosis not present

## 2016-05-04 DIAGNOSIS — B078 Other viral warts: Secondary | ICD-10-CM | POA: Diagnosis not present

## 2016-05-04 DIAGNOSIS — Z85828 Personal history of other malignant neoplasm of skin: Secondary | ICD-10-CM | POA: Diagnosis not present

## 2016-05-14 DIAGNOSIS — C44319 Basal cell carcinoma of skin of other parts of face: Secondary | ICD-10-CM | POA: Diagnosis not present

## 2016-05-14 DIAGNOSIS — Z85828 Personal history of other malignant neoplasm of skin: Secondary | ICD-10-CM | POA: Diagnosis not present

## 2016-05-29 DIAGNOSIS — R350 Frequency of micturition: Secondary | ICD-10-CM | POA: Diagnosis not present

## 2016-07-03 DIAGNOSIS — R350 Frequency of micturition: Secondary | ICD-10-CM | POA: Diagnosis not present

## 2016-08-07 DIAGNOSIS — R350 Frequency of micturition: Secondary | ICD-10-CM | POA: Diagnosis not present

## 2016-09-04 DIAGNOSIS — R350 Frequency of micturition: Secondary | ICD-10-CM | POA: Diagnosis not present

## 2016-09-06 DIAGNOSIS — R69 Illness, unspecified: Secondary | ICD-10-CM | POA: Diagnosis not present

## 2016-09-28 ENCOUNTER — Other Ambulatory Visit: Payer: Self-pay | Admitting: Family Medicine

## 2016-09-28 DIAGNOSIS — Z79899 Other long term (current) drug therapy: Secondary | ICD-10-CM

## 2016-09-28 DIAGNOSIS — Z0000 Encounter for general adult medical examination without abnormal findings: Secondary | ICD-10-CM

## 2016-09-28 DIAGNOSIS — Z8042 Family history of malignant neoplasm of prostate: Secondary | ICD-10-CM

## 2016-09-28 DIAGNOSIS — E782 Mixed hyperlipidemia: Secondary | ICD-10-CM

## 2016-10-01 ENCOUNTER — Other Ambulatory Visit: Payer: Medicare HMO

## 2016-10-01 DIAGNOSIS — Z79899 Other long term (current) drug therapy: Secondary | ICD-10-CM

## 2016-10-01 DIAGNOSIS — E782 Mixed hyperlipidemia: Secondary | ICD-10-CM

## 2016-10-01 DIAGNOSIS — Z125 Encounter for screening for malignant neoplasm of prostate: Secondary | ICD-10-CM | POA: Diagnosis not present

## 2016-10-01 DIAGNOSIS — Z8042 Family history of malignant neoplasm of prostate: Secondary | ICD-10-CM

## 2016-10-01 DIAGNOSIS — Z0000 Encounter for general adult medical examination without abnormal findings: Secondary | ICD-10-CM | POA: Diagnosis not present

## 2016-10-01 DIAGNOSIS — E785 Hyperlipidemia, unspecified: Secondary | ICD-10-CM | POA: Diagnosis not present

## 2016-10-02 DIAGNOSIS — R350 Frequency of micturition: Secondary | ICD-10-CM | POA: Diagnosis not present

## 2016-10-02 LAB — COMPLETE METABOLIC PANEL WITH GFR
AG RATIO: 1.6 (calc) (ref 1.0–2.5)
ALBUMIN MSPROF: 4.2 g/dL (ref 3.6–5.1)
ALT: 31 U/L (ref 9–46)
AST: 28 U/L (ref 10–35)
Alkaline phosphatase (APISO): 45 U/L (ref 40–115)
BUN / CREAT RATIO: 13 (calc) (ref 6–22)
BUN: 18 mg/dL (ref 7–25)
CALCIUM: 9.6 mg/dL (ref 8.6–10.3)
CO2: 24 mmol/L (ref 20–32)
Chloride: 104 mmol/L (ref 98–110)
Creat: 1.36 mg/dL — ABNORMAL HIGH (ref 0.70–1.18)
GFR, EST AFRICAN AMERICAN: 58 mL/min/{1.73_m2} — AB (ref >=60)
GFR, EST NON AFRICAN AMERICAN: 50 mL/min/{1.73_m2} — AB (ref >=60)
Globulin: 2.7 g/dL (calc) (ref 1.9–3.7)
Glucose, Bld: 96 mg/dL (ref 65–99)
POTASSIUM: 4.6 mmol/L (ref 3.5–5.3)
Sodium: 139 mmol/L (ref 135–146)
TOTAL PROTEIN: 6.9 g/dL (ref 6.1–8.1)
Total Bilirubin: 1.1 mg/dL (ref 0.2–1.2)

## 2016-10-02 LAB — LIPID PANEL
Cholesterol: 152 mg/dL (ref <200)
HDL: 39 mg/dL — AB (ref >40)
LDL Cholesterol (Calc): 86 mg/dL (calc)
NON-HDL CHOLESTEROL (CALC): 113 mg/dL (ref <130)
TRIGLYCERIDES: 169 mg/dL — AB (ref <150)
Total CHOL/HDL Ratio: 3.9 (calc) (ref <5.0)

## 2016-10-02 LAB — CBC WITH DIFFERENTIAL/PLATELET
BASOS PCT: 0.5 %
Basophils Absolute: 38 cells/uL (ref 0–200)
EOS PCT: 2.7 %
Eosinophils Absolute: 203 cells/uL (ref 15–500)
HEMATOCRIT: 48.9 % (ref 38.5–50.0)
Hemoglobin: 16.7 g/dL (ref 13.2–17.1)
LYMPHS ABS: 2130 {cells}/uL (ref 850–3900)
MCH: 30.9 pg (ref 27.0–33.0)
MCHC: 34.2 g/dL (ref 32.0–36.0)
MCV: 90.6 fL (ref 80.0–100.0)
MPV: 12.1 fL (ref 7.5–12.5)
Monocytes Relative: 8.9 %
NEUTROS ABS: 4463 {cells}/uL (ref 1500–7800)
Neutrophils Relative %: 59.5 %
Platelets: 172 10*3/uL (ref 140–400)
RBC: 5.4 10*6/uL (ref 4.20–5.80)
RDW: 12.6 % (ref 11.0–15.0)
Total Lymphocyte: 28.4 %
WBC: 7.5 10*3/uL (ref 3.8–10.8)
WBCMIX: 668 {cells}/uL (ref 200–950)

## 2016-10-02 LAB — PSA: PSA: 1.3 ng/mL (ref <=4.0)

## 2016-10-08 ENCOUNTER — Ambulatory Visit (INDEPENDENT_AMBULATORY_CARE_PROVIDER_SITE_OTHER): Payer: Medicare HMO | Admitting: Family Medicine

## 2016-10-08 ENCOUNTER — Encounter: Payer: Self-pay | Admitting: Family Medicine

## 2016-10-08 VITALS — BP 146/90 | HR 84 | Temp 97.8°F | Resp 16 | Ht 72.0 in | Wt 218.0 lb

## 2016-10-08 DIAGNOSIS — E7800 Pure hypercholesterolemia, unspecified: Secondary | ICD-10-CM | POA: Diagnosis not present

## 2016-10-08 DIAGNOSIS — Z125 Encounter for screening for malignant neoplasm of prostate: Secondary | ICD-10-CM | POA: Diagnosis not present

## 2016-10-08 DIAGNOSIS — I10 Essential (primary) hypertension: Secondary | ICD-10-CM

## 2016-10-08 DIAGNOSIS — Z23 Encounter for immunization: Secondary | ICD-10-CM

## 2016-10-08 DIAGNOSIS — Z0000 Encounter for general adult medical examination without abnormal findings: Secondary | ICD-10-CM | POA: Diagnosis not present

## 2016-10-08 MED ORDER — LOSARTAN POTASSIUM 50 MG PO TABS: 50.0000 mg | ORAL_TABLET | Freq: Every day | ORAL | 3 refills | Status: DC

## 2016-10-08 NOTE — Progress Notes (Signed)
Subjective:    Patient ID: Michael Nguyen, male    DOB: 06-18-1940, 76 y.o.   MRN: [id number]  HPI  Here for his CPE. His blood pressure today is elevated at 146/90. I repeated his blood pressure and found to be 148/92. He states that his blood pressure has been ranging in that area at home as well. He denies any chest pain shortness of breath or dyspnea on exertion. His most recent lab work as listed below. His last colonoscopy was in 2014 and is not due again until 2019 Appointment on 10/01/2016  Component Date Value Ref Range Status  . PSA 10/01/2016 1.3  < OR = 4.0 ng/mL Final   Comment: The total PSA value from this assay system is  standardized against the WHO standard. The test  result will be approximately 20% lower when compared  to the equimolar-standardized total PSA (Beckman  Coulter). Comparison of serial PSA results should be  interpreted with this fact in mind. . This test was performed using the Siemens  chemiluminescent method. Values obtained from  different assay methods cannot be used interchangeably. PSA levels, regardless of value, should not be interpreted as absolute evidence of the presence or absence of disease.   . WBC 10/01/2016 7.5  3.8 - 10.8 Thousand/uL Final  . RBC 10/01/2016 5.40  4.20 - 5.80 Million/uL Final  . Hemoglobin 10/01/2016 16.7  13.2 - 17.1 g/dL Final  . HCT 10/01/2016 48.9  38.5 - 50.0 % Final  . MCV 10/01/2016 90.6  80.0 - 100.0 fL Final  . MCH 10/01/2016 30.9  27.0 - 33.0 pg Final  . MCHC 10/01/2016 34.2  32.0 - 36.0 g/dL Final  . RDW 10/01/2016 12.6  11.0 - 15.0 % Final  . Platelets 10/01/2016 172  140 - 400 Thousand/uL Final  . MPV 10/01/2016 12.1  7.5 - 12.5 fL Final  . Neutro Abs 10/01/2016 4463  1,500 - 7,800 cells/uL Final  . Lymphs Abs 10/01/2016 2130  850 - 3,900 cells/uL Final  . WBC mixed population 10/01/2016 668  200 - 950 cells/uL Final  . Eosinophils Absolute 10/01/2016 203  15 - 500 cells/uL Final  . Basophils  Absolute 10/01/2016 38  0 - 200 cells/uL Final  . Neutrophils Relative % 10/01/2016 59.5  % Final  . Total Lymphocyte 10/01/2016 28.4  % Final  . Monocytes Relative 10/01/2016 8.9  % Final  . Eosinophils Relative 10/01/2016 2.7  % Final  . Basophils Relative 10/01/2016 0.5  % Final  . Cholesterol 10/01/2016 152  <200 mg/dL Final  . HDL 10/01/2016 39* >40 mg/dL Final  . Triglycerides 10/01/2016 169* <150 mg/dL Final  . LDL Cholesterol (Calc) 10/01/2016 86  mg/dL (calc) Final   Comment: Reference range: <100 . Desirable range <100 mg/dL for primary prevention;   <70 mg/dL for patients with CHD or diabetic patients  with > or = 2 CHD risk factors. Marland Kitchen LDL-C is now calculated using the Martin-Hopkins  calculation, which is a validated novel method providing  better accuracy than the Friedewald equation in the  estimation of LDL-C.  Cresenciano Genre et al. Annamaria Helling. 5102;585(27): 2061-2068  (http://education.[facility].com/faq/FAQ164)   . Total CHOL/HDL Ratio 10/01/2016 3.9  <5.0 (calc) Final  . Non-HDL Cholesterol (Calc) 10/01/2016 113  <130 mg/dL (calc) Final   Comment: For patients with diabetes plus 1 major ASCVD risk  factor, treating to a non-HDL-C goal of <100 mg/dL  (LDL-C of <70 mg/dL) is considered a therapeutic  option.   Marland Kitchen  Glucose, Bld 10/01/2016 96  65 - 99 mg/dL Final   Comment: .            Fasting reference interval .   . BUN 10/01/2016 18  7 - 25 mg/dL Final  . Creat 10/01/2016 1.36* 0.70 - 1.18 mg/dL Final   Comment: For patients >53 years of age, the reference limit for Creatinine is approximately 13% higher for people identified as African-American. .   . GFR, Est Non African American 10/01/2016 50* > OR = 60 mL/min/1.35m2 Final  . GFR, Est African American 10/01/2016 58* > OR = 60 mL/min/1.26m2 Final  . BUN/Creatinine Ratio 10/01/2016 13  6 - 22 (calc) Final  . Sodium 10/01/2016 139  135 - 146 mmol/L Final  . Potassium 10/01/2016 4.6  3.5 - 5.3 mmol/L Final  .  Chloride 10/01/2016 104  98 - 110 mmol/L Final  . CO2 10/01/2016 24  20 - 32 mmol/L Final  . Calcium 10/01/2016 9.6  8.6 - 10.3 mg/dL Final  . Total Protein 10/01/2016 6.9  6.1 - 8.1 g/dL Final  . Albumin 10/01/2016 4.2  3.6 - 5.1 g/dL Final  . Globulin 10/01/2016 2.7  1.9 - 3.7 g/dL (calc) Final  . AG Ratio 10/01/2016 1.6  1.0 - 2.5 (calc) Final  . Total Bilirubin 10/01/2016 1.1  0.2 - 1.2 mg/dL Final  . Alkaline phosphatase (APISO) 10/01/2016 45  40 - 115 U/L Final  . AST 10/01/2016 28  10 - 35 U/L Final  . ALT 10/01/2016 31  9 - 46 U/L Final    Subjective:    Review Past Medical/Family/Social: Past Medical History:  Diagnosis Date  . Allergy   . Arthritis    "some" per pt  . BPH (benign prostatic hypertrophy)   . Cancer ([place])    skin- basal and squamous cell CA  . History of ETT    with dr. Acie Fredrickson was normal per patient  . Hyperlipidemia    Past Surgical History:  Procedure Laterality Date  . cataracts     both eyes  . COLONOSCOPY    . TONSILLECTOMY     Current Outpatient Prescriptions on File Prior to Visit  Medication Sig Dispense Refill  . ALOE VERA PO Take by mouth daily.    . Ascorbic Acid (VITAMIN C PO) Take by mouth daily.    Marland Kitchen aspirin 81 MG tablet Take 81 mg by mouth daily.      Marland Kitchen atorvastatin (LIPITOR) 40 MG tablet Take 1 tablet (40 mg total) by mouth daily. 90 tablet 3  . cetirizine (ZYRTEC) 10 MG tablet Take 10 mg by mouth daily.    . Cholecalciferol (VITAMIN D3) 1000 UNITS CAPS Take 1 capsule by mouth 2 (two) times daily.      . fish oil-omega-3 fatty acids 1000 MG capsule Take 1 g by mouth 2 (two) times daily.      . fluticasone (FLONASE) 50 MCG/ACT nasal spray Place 2 sprays into both nostrils daily.    Marland Kitchen GLUCOSAMINE-CHONDROITIN-VIT C PO Take 2,000 mg by mouth daily.      . Multiple Vitamin (MULTIVITAMIN) tablet Take 1 tablet by mouth daily.      . naproxen sodium (ALEVE) 220 MG tablet Take 220 mg by mouth as needed.       No current  facility-administered medications on file prior to visit.    Allergies  Allergen Reactions  . Prednisone     REACTION: ? intolerance per patient / Cramps   Social History  Social History  . Marital status: Married    Spouse name: N/A  . Number of children: 2  . Years of education: N/A   Occupational History  . Mechanic    Social History Main Topics  . Smoking status: Former Smoker    Quit date: 01/23/1967  . Smokeless tobacco: Never Used  . Alcohol use No  . Drug use: No  . Sexual activity: Not on file   Other Topics Concern  . Not on file   Social History Narrative   Lives in [place]   Family History  Problem Relation Age of Onset  . Coronary artery disease Brother        x2. One had CABG at 24. other with CABG in his 11's. Father with MI in his 30's  . Esophageal cancer Father   . Colon cancer Neg Hx   . Rectal cancer Neg Hx   . Stomach cancer Neg Hx      Risk Factors  Current exercise habits:  Dietary issues discussed:   Cardiac risk factors: Obesity (BMI >= 30 kg/m2).   Depression Screen  (Note: if answer to either of the following is "Yes", a more complete depression screening is indicated)  Over the past two weeks, have you felt down, depressed or hopeless? No Over the past two weeks, have you felt little interest or pleasure in doing things? No Have you lost interest or pleasure in daily life? No Do you often feel hopeless? No Do you cry easily over simple problems? No   Activities of Daily Living  In your present state of health, do you have any difficulty performing the following activities?:  Driving? No  Managing money? No  Feeding yourself? No  Getting from bed to chair? No  Climbing a flight of stairs? No  Preparing food and eating?: No  Bathing or showering? No  Getting dressed: No  Getting to the toilet? No  Using the toilet:No  Moving around from place to place: No  In the past year have you fallen or had a near fall?:No  Are  you sexually active? No  Do you have more than one partner? No   Hearing Difficulties: No  Do you often ask people to speak up or repeat themselves? No  Do you experience ringing or noises in your ears? No Do you have difficulty understanding soft or whispered voices? No  Do you feel that you have a problem with memory? No Do you often misplace items? No  Do you feel safe at home? Yes  Cognitive Testing  Alert? Yes Normal Appearance?Yes  Oriented to person? Yes Place? Yes  Time? Yes  Recall of three objects? Yes  Can perform simple calculations? Yes  Displays appropriate judgment?Yes  Can read the correct time from a watch face?Yes  Screening Tests / Date Colonoscopy   2014                  Zostavax 2008 Pneumovax 2007 prevnar today  Influenza Vaccine  Tetanus/tdap 2006 Review of Systems  All other systems reviewed and are negative.      Objective:   Physical Exam  Constitutional: He is oriented to person, place, and time. He appears well-developed and well-nourished. No distress.  HENT:  Head: Normocephalic and atraumatic.  Right Ear: External ear normal.  Left Ear: External ear normal.  Nose: Nose normal.  Mouth/Throat: Oropharynx is clear and moist. No oropharyngeal exudate.  Eyes: Pupils are equal, round, and reactive to light.  Conjunctivae and EOM are normal. Right eye exhibits no discharge. Left eye exhibits no discharge. No scleral icterus.  Neck: Normal range of motion. Neck supple. No JVD present. No tracheal deviation present. No thyromegaly present.  Cardiovascular: Normal rate, regular rhythm, normal heart sounds and intact distal pulses.  Exam reveals no gallop and no friction rub.   No murmur heard. Pulmonary/Chest: Effort normal and breath sounds normal. No stridor. No respiratory distress. He has no wheezes. He has no rales. He exhibits no tenderness.  Abdominal: Soft. Bowel sounds are normal. He exhibits no distension and no mass. There is no tenderness.  There is no rebound and no guarding.  Genitourinary: Rectum normal. Prostate is not enlarged and not tender.  Musculoskeletal: Normal range of motion. He exhibits no edema or tenderness.  Lymphadenopathy:    He has no cervical adenopathy.  Neurological: He is alert and oriented to person, place, and time. He has normal reflexes. No cranial nerve deficit. He exhibits normal muscle tone. Coordination normal.  Skin: Skin is warm. No rash noted. He is not diaphoretic. No erythema. No pallor.  Psychiatric: He has a normal mood and affect. His behavior is normal. Judgment and thought content normal.  Vitals reviewed.         Assessment & Plan:  Routine general medical examination at a health care facility Patient's physical exam is normal. His lab work is excellent. The patient's colonoscopy is up-to-date. Digital rectal exam today is normal and PSA is within normal limits. His cholesterol is outstanding. I am concerned that his blood pressure being elevated. Therefore I will start the patient on losartan 50 mg by mouth daily and asked him to recheck his blood pressure in one month. He received his flu shot today. We discussed the tetanus shot which he personally declined. We also discussed shingrix but I advised him to check on the cost prior to receiving the vaccine. Medicare Attestation  I have personally reviewed:  The patient's medical and social history  Their use of alcohol, tobacco or illicit drugs  Their current medications and supplements  The patient's functional ability including ADLs,fall risks, home safety risks, cognitive, and hearing and visual impairment  Diet and physical activities  Evidence for depression or mood disorders  The patient's weight, height, BMI, and visual acuity have been recorded in the chart. I have made referrals, counseling, and provided education to the patient based on review of the above and I have provided the patient with a written personalized care plan  for preventive services.  Patient ID: Michael Nguyen, male   DOB: 10/06/1940, 76 y.o.   MRN: [id number]

## 2016-11-06 DIAGNOSIS — R350 Frequency of micturition: Secondary | ICD-10-CM | POA: Diagnosis not present

## 2016-11-12 DIAGNOSIS — R69 Illness, unspecified: Secondary | ICD-10-CM | POA: Diagnosis not present

## 2016-11-22 DIAGNOSIS — R69 Illness, unspecified: Secondary | ICD-10-CM | POA: Diagnosis not present

## 2016-12-05 ENCOUNTER — Other Ambulatory Visit: Payer: Self-pay | Admitting: Family Medicine

## 2016-12-11 DIAGNOSIS — R350 Frequency of micturition: Secondary | ICD-10-CM | POA: Diagnosis not present

## 2017-01-17 DIAGNOSIS — R350 Frequency of micturition: Secondary | ICD-10-CM | POA: Diagnosis not present

## 2017-02-21 DIAGNOSIS — R350 Frequency of micturition: Secondary | ICD-10-CM | POA: Diagnosis not present

## 2017-03-28 DIAGNOSIS — R350 Frequency of micturition: Secondary | ICD-10-CM | POA: Diagnosis not present

## 2017-04-22 ENCOUNTER — Encounter: Payer: Self-pay | Admitting: Internal Medicine

## 2017-05-02 DIAGNOSIS — R350 Frequency of micturition: Secondary | ICD-10-CM | POA: Diagnosis not present

## 2017-05-06 ENCOUNTER — Encounter: Payer: Self-pay | Admitting: Family Medicine

## 2017-05-06 DIAGNOSIS — L814 Other melanin hyperpigmentation: Secondary | ICD-10-CM | POA: Diagnosis not present

## 2017-05-06 DIAGNOSIS — L821 Other seborrheic keratosis: Secondary | ICD-10-CM | POA: Diagnosis not present

## 2017-05-06 DIAGNOSIS — D225 Melanocytic nevi of trunk: Secondary | ICD-10-CM | POA: Diagnosis not present

## 2017-05-06 DIAGNOSIS — Z85828 Personal history of other malignant neoplasm of skin: Secondary | ICD-10-CM | POA: Diagnosis not present

## 2017-05-06 DIAGNOSIS — D0462 Carcinoma in situ of skin of left upper limb, including shoulder: Secondary | ICD-10-CM | POA: Diagnosis not present

## 2017-06-06 DIAGNOSIS — R350 Frequency of micturition: Secondary | ICD-10-CM | POA: Diagnosis not present

## 2017-06-19 ENCOUNTER — Encounter: Payer: Self-pay | Admitting: Family Medicine

## 2017-06-19 ENCOUNTER — Ambulatory Visit (INDEPENDENT_AMBULATORY_CARE_PROVIDER_SITE_OTHER): Payer: Medicare HMO | Admitting: Family Medicine

## 2017-06-19 VITALS — BP 130/80 | HR 98 | Temp 98.0°F | Resp 14 | Ht 72.0 in | Wt 217.0 lb

## 2017-06-19 DIAGNOSIS — J309 Allergic rhinitis, unspecified: Secondary | ICD-10-CM | POA: Diagnosis not present

## 2017-06-19 DIAGNOSIS — R059 Cough, unspecified: Secondary | ICD-10-CM

## 2017-06-19 DIAGNOSIS — R05 Cough: Secondary | ICD-10-CM

## 2017-06-19 MED ORDER — BENZONATATE 100 MG PO CAPS: 100.0000 mg | ORAL_CAPSULE | Freq: Three times a day (TID) | ORAL | 0 refills | Status: DC | PRN

## 2017-06-19 MED ORDER — MONTELUKAST SODIUM 10 MG PO TABS: 10.0000 mg | ORAL_TABLET | Freq: Every day | ORAL | 0 refills | Status: DC

## 2017-06-19 NOTE — Patient Instructions (Addendum)
Increase allergy control medications.  Take daily antihistamine  Steroid nasal spray  Stop afrin - or at least do not use more than 2-3 days in a row, ever  Add montelukast to help allergy control  Your nasal tissue looks very irritated and raw.  Do flonase every other day, and add a saline nasal spray at least every other day, but daily and before bed would help with hydration and irritation.  Add a cool mist humidifier and make sure you are not sleeping under vents or high flow fans.    Continue mucinex daily for the next week and drink ample fluids.  Use cough medicine as needed.    Please call if you feel worse.

## 2017-06-19 NOTE — Progress Notes (Signed)
Patient ID: Michael Nguyen, male    DOB: 23-May-1940, 77 y.o.   MRN: [id number]  PCP: Susy Frizzle, MD  Chief Complaint  Patient presents with  . Chest congestion, cough    Subjective:   Michael Nguyen is a 77 y.o. male complains of intermittent chest congestion with cough that has been coming and going for the past 3 weeks.  When cough is productive sputum is white to yellow.  Coughing symptoms seem to be worse when he has more nasal discharge and postnasal drip, does have a history of intermittent allergies and nasal symptoms.  He does use Afrin nasal spray often but he states he just uses a very small amount to help him breathe when he is very congested.  Nasal allergies and drainage seem to worsen about 3 weeks ago but then nearly resolved and reoccurred 2 weeks ago when he traveled to [place] for a vacation with his wife.  Symptoms have been ongoing but very mild. Currently denies any chest pain, chest tightness, shortness of breath, wheeze, fatigue, fever, sweats, chills, rash, body aches, weight loss.   Patient Active Problem List   Diagnosis Date Noted  . HYPERLIPIDEMIA-MIXED 03/24/2009  . Personal history of colonic adenomas 04/17/2007     Prior to Admission medications   Medication Sig Start Date End Date Taking? Authorizing Provider  ALOE VERA PO Take by mouth daily.   Yes [provider]  Ascorbic Acid (VITAMIN C PO) Take by mouth daily.   Yes [provider]  aspirin 81 MG tablet Take 81 mg by mouth daily.     Yes [provider]  atorvastatin (LIPITOR) 40 MG tablet TAKE ONE TABLET BY MOUTH ONCE DAILY 12/05/16  Yes [place], Modena Nunnery, MD  Cholecalciferol (VITAMIN D3) 1000 UNITS CAPS Take 1 capsule by mouth 2 (two) times daily.     Yes [provider]  fexofenadine (ALLEGRA) 180 MG tablet Take 180 mg by mouth daily.   Yes [provider]  fish oil-omega-3 fatty acids 1000 MG capsule Take 1 g by mouth 2 (two) times daily.      Yes [provider]  fluticasone (FLONASE) 50 MCG/ACT nasal spray Place 2 sprays into both nostrils daily.   Yes [provider]  GLUCOSAMINE-CHONDROITIN-VIT C PO Take 2,000 mg by mouth daily.     Yes [provider]  losartan (COZAAR) 50 MG tablet Take 1 tablet (50 mg total) by mouth daily. 10/08/16  Yes Susy Frizzle, MD  Multiple Vitamin (MULTIVITAMIN) tablet Take 1 tablet by mouth daily.     Yes [provider]  naproxen sodium (ALEVE) 220 MG tablet Take 220 mg by mouth as needed.     Yes [provider]     Allergies  Allergen Reactions  . Prednisone     REACTION: ? intolerance per patient / Cramps     Family History  Problem Relation Age of Onset  . Coronary artery disease Brother        x2. One had CABG at 74. other with CABG in his 80's. Father with MI in his 43's  . Esophageal cancer Father   . Colon cancer Neg Hx   . Rectal cancer Neg Hx   . Stomach cancer Neg Hx      Social History   Socioeconomic History  . Marital status: Married    Spouse name: Not on file  . Number of children: 2  . Years of education: Not on  file  . Highest education level: Not on file  Occupational History  . Occupation: Manufacturing systems engineer  . Financial resource strain: Not on file  . Food insecurity:    Worry: Not on file    Inability: Not on file  . Transportation needs:    Medical: Not on file    Non-medical: Not on file  Tobacco Use  . Smoking status: Former Smoker    Last attempt to quit: 01/23/1967    Years since quitting: 50.4  . Smokeless tobacco: Never Used  Substance and Sexual Activity  . Alcohol use: No  . Drug use: No  . Sexual activity: Not on file  Lifestyle  . Physical activity:    Days per week: Not on file    Minutes per session: Not on file  . Stress: Not on file  Relationships  . Social connections:    Talks on phone: Not on file    Gets together: Not on file    Attends religious service: Not on file     Active member of club or organization: Not on file    Attends meetings of clubs or organizations: Not on file    Relationship status: Not on file  . Intimate partner violence:    Fear of current or ex partner: Not on file    Emotionally abused: Not on file    Physically abused: Not on file    Forced sexual activity: Not on file  Other Topics Concern  . Not on file  Social History Narrative   Lives in [place]  Constitutional: Negative.  Negative for activity change, appetite change, chills, diaphoresis, fatigue, fever and unexpected weight change.  HENT: Positive for postnasal drip, rhinorrhea and sneezing. Negative for congestion, sinus pressure, sinus pain, sore throat, tinnitus, trouble swallowing and voice change.   Eyes: Negative.   Respiratory: Positive for cough. Negative for apnea, choking, chest tightness, shortness of breath, wheezing and stridor.   Cardiovascular: Negative for chest pain, palpitations and leg swelling.  Gastrointestinal: Negative.   Endocrine: Negative.   Genitourinary: Negative.   Musculoskeletal: Negative.   Skin: Negative.  Negative for color change, pallor and rash.  Allergic/Immunologic: Positive for environmental allergies. Negative for immunocompromised state.  Neurological: Negative.  Negative for dizziness, tremors, syncope, weakness, light-headedness, numbness and headaches.  Hematological: Negative.   Psychiatric/Behavioral: Negative.   All other systems reviewed and are negative.      Objective:    Vitals:   06/19/17 0945  BP: 130/80  Pulse: 98  Resp: 14  Temp: 98 F (36.7 C)  TempSrc: Oral  SpO2: 94%  Weight: 217 lb (98.4 kg)  Height: 6' (1.829 m)      Physical Exam  Constitutional: He is oriented to person, place, and time. He appears well-developed and well-nourished.  Non-toxic appearance. He does not appear ill. No distress.  HENT:  Head: Normocephalic and atraumatic.  Right Ear: Tympanic  membrane, external ear and ear canal normal.  Left Ear: Tympanic membrane, external ear and ear canal normal.  Nose: No mucosal edema or rhinorrhea. Right sinus exhibits no maxillary sinus tenderness and no frontal sinus tenderness. Left sinus exhibits no maxillary sinus tenderness and no frontal sinus tenderness.  Mouth/Throat: Uvula is midline and oropharynx is clear and moist. No trismus in the jaw. No uvula swelling. No oropharyngeal exudate, posterior oropharyngeal edema or posterior oropharyngeal erythema.  Nasal turbinates enlarged and pale with mild clear discharge, no sinus  tenderness to palpation  Eyes: Pupils are equal, round, and reactive to light. Conjunctivae, EOM and lids are normal. No scleral icterus.  Neck: Trachea normal, normal range of motion and phonation normal. Neck supple. No tracheal deviation present.  Cardiovascular: Regular rhythm, normal heart sounds, intact distal pulses and normal pulses. Exam reveals no gallop and no friction rub.  No murmur heard. Pulses:      Radial pulses are 2+ on the right side, and 2+ on the left side.       Posterior tibial pulses are 2+ on the right side, and 2+ on the left side.  Pulmonary/Chest: Effort normal and breath sounds normal. No stridor. No respiratory distress. He has no wheezes. He has no rhonchi. He has no rales. He exhibits no tenderness.  Abdominal: Soft. Normal appearance and bowel sounds are normal. He exhibits no distension. There is no tenderness. There is no rebound and no guarding.  Musculoskeletal: Normal range of motion. He exhibits no edema.  Lymphadenopathy:    He has no cervical adenopathy.  Neurological: He is alert and oriented to person, place, and time. He exhibits normal muscle tone. Coordination and gait normal.  Skin: Skin is warm, dry and intact. Capillary refill takes less than 2 seconds. No rash noted. He is not diaphoretic. No erythema. No pallor.  Psychiatric: He has a normal mood and affect. His  speech is normal and behavior is normal.  Nursing note and vitals reviewed.         Assessment & Plan:      ICD-10-CM   1. Allergic rhinitis, unspecified seasonality, unspecified trigger J30.9   2. Cough R05 benzonatate (TESSALON) 100 MG capsule    Patient with intermittent nasal allergies, advised to start a daily antihistamine, add steroid nasal spray, decrease and then stop use of Afrin, and if not improved to add montelukast asked for allergy control.    Suspect cough is secondary to postnasal drip, continue Mucinex, Tessalon provided for symptoms.  Lungs are clear to auscultation anteriorly and posteriorly.  Supportive treatment for cough, return if not improving or if worsening.   Delsa Grana, PA-C 06/19/17 9:59 AM

## 2017-06-20 ENCOUNTER — Other Ambulatory Visit: Payer: Self-pay

## 2017-06-20 ENCOUNTER — Ambulatory Visit (AMBULATORY_SURGERY_CENTER): Payer: Self-pay

## 2017-06-20 VITALS — Ht 72.0 in | Wt 219.4 lb

## 2017-06-20 DIAGNOSIS — Z86010 Personal history of colonic polyps: Secondary | ICD-10-CM

## 2017-06-20 NOTE — Progress Notes (Signed)
No egg or soy allergy known to patient  No issues with past sedation with any surgeries  or procedures, no intubation problems  No diet pills per patient No home 02 use per patient  No blood thinners per patient  Pt denies issues with constipation  No A fib or A flutter  EMMI video sent to pt's e mail , pt declined    

## 2017-06-21 ENCOUNTER — Encounter: Payer: Self-pay | Admitting: Internal Medicine

## 2017-07-04 ENCOUNTER — Ambulatory Visit (AMBULATORY_SURGERY_CENTER): Payer: Medicare HMO | Admitting: Internal Medicine

## 2017-07-04 ENCOUNTER — Other Ambulatory Visit: Payer: Self-pay

## 2017-07-04 ENCOUNTER — Encounter: Payer: Self-pay | Admitting: Internal Medicine

## 2017-07-04 VITALS — BP 108/76 | HR 72 | Temp 97.8°F | Resp 19 | Ht 72.0 in | Wt 219.0 lb

## 2017-07-04 DIAGNOSIS — K219 Gastro-esophageal reflux disease without esophagitis: Secondary | ICD-10-CM | POA: Diagnosis not present

## 2017-07-04 DIAGNOSIS — D123 Benign neoplasm of transverse colon: Secondary | ICD-10-CM

## 2017-07-04 DIAGNOSIS — D120 Benign neoplasm of cecum: Secondary | ICD-10-CM

## 2017-07-04 DIAGNOSIS — D124 Benign neoplasm of descending colon: Secondary | ICD-10-CM | POA: Diagnosis not present

## 2017-07-04 DIAGNOSIS — D125 Benign neoplasm of sigmoid colon: Secondary | ICD-10-CM

## 2017-07-04 DIAGNOSIS — Z86010 Personal history of colonic polyps: Secondary | ICD-10-CM

## 2017-07-04 NOTE — Patient Instructions (Addendum)
I found and removed 8 small polyps today. I was hoping to tell you no more colonoscopy (routine) but I think we should consider another one in 3 years if you remain as vigorous as you are. As always, will be up to you.  You also have melanosis - not a problem - staining of the colon lining from a laxative.  I will let you know pathology results and when to have another routine colonoscopy by mail and/or My Chart.  I appreciate the opportunity to care for you.  Polyp handout given to patient. Hemorrhoid handout given to patient.  Resume previous diet. Continue present medications.  Repeat colonoscopy recommended.  Date to be determined after pathology results reviewed.  YOU HAD AN ENDOSCOPIC PROCEDURE TODAY AT [facility]:   Refer to the procedure report that was given to you for any specific questions about what was found during the examination.  If the procedure report does not answer your questions, please call your gastroenterologist to clarify.  If you requested that your care partner not be given the details of your procedure findings, then the procedure report has been included in a sealed envelope for you to review at your convenience later.  YOU SHOULD EXPECT: Some feelings of bloating in the abdomen. Passage of more gas than usual.  Walking can help get rid of the air that was put into your GI tract during the procedure and reduce the bloating. If you had a lower endoscopy (such as a colonoscopy or flexible sigmoidoscopy) you may notice spotting of blood in your stool or on the toilet paper. If you underwent a bowel prep for your procedure, you may not have a normal bowel movement for a few days.  Please Note:  You might notice some irritation and congestion in your nose or some drainage.  This is from the oxygen used during your procedure.  There is no need for concern and it should clear up in a day or so.  SYMPTOMS TO REPORT IMMEDIATELY:   Following  lower endoscopy (colonoscopy or flexible sigmoidoscopy):  Excessive amounts of blood in the stool  Significant tenderness or worsening of abdominal pains  Swelling of the abdomen that is new, acute  Fever of 100F or higher  For urgent or emergent issues, a gastroenterologist can be reached at any hour by calling [phone].   DIET:  We do recommend a small meal at first, but then you may proceed to your regular diet.  Drink plenty of fluids but you should avoid alcoholic beverages for 24 hours.  ACTIVITY:  You should plan to take it easy for the rest of today and you should NOT DRIVE or use heavy machinery until tomorrow (because of the sedation medicines used during the test).    FOLLOW UP: Our staff will call the number listed on your records the next business day following your procedure to check on you and address any questions or concerns that you may have regarding the information given to you following your procedure. If we do not reach you, we will leave a message.  However, if you are feeling well and you are not experiencing any problems, there is no need to return our call.  We will assume that you have returned to your regular daily activities without incident.  If any biopsies were taken you will be contacted by phone or by letter within the next 1-3 weeks.  Please call us at [phone] if you have not  heard about the biopsies in 3 weeks.    SIGNATURES/CONFIDENTIALITY: You and/or your care partner have signed paperwork which will be entered into your electronic medical record.  These signatures attest to the fact that that the information above on your After Visit Summary has been reviewed and is understood.  Full responsibility of the confidentiality of this discharge information lies with you and/or your care-partner.

## 2017-07-04 NOTE — Progress Notes (Signed)
Called to room to assist during endoscopic procedure.  Patient ID and intended procedure confirmed with present staff. Received instructions for my participation in the procedure from the performing physician.  

## 2017-07-04 NOTE — Progress Notes (Signed)
Report given to PACU, vss 

## 2017-07-04 NOTE — Op Note (Signed)
[facility] Patient Name: Michael Nguyen Procedure Date: 07/04/2017 8:05 AM MRN: [id number] Endoscopist: Gatha Mayer , MD Age: 77 Referring MD:  Date of Birth: Sep 25, 1940 Gender: Male Account #: [id number] Procedure:                Colonoscopy Indications:              Surveillance: Personal history of adenomatous                            polyps on last colonoscopy 5 years ago Medicines:                Propofol per Anesthesia, Monitored Anesthesia Care Procedure:                Pre-Anesthesia Assessment:                           - Prior to the procedure, a History and Physical                            was performed, and patient medications and                            allergies were reviewed. The patient's tolerance of                            previous anesthesia was also reviewed. The risks                            and benefits of the procedure and the sedation                            options and risks were discussed with the patient.                            All questions were answered, and informed consent                            was obtained. Prior Anticoagulants: The patient has                            taken no previous anticoagulant or antiplatelet                            agents. ASA Grade Assessment: I - A normal, healthy                            patient. After reviewing the risks and benefits,                            the patient was deemed in satisfactory condition to                            undergo the procedure.  After obtaining informed consent, the colonoscope                            was passed under direct vision. Throughout the                            procedure, the patient's blood pressure, pulse, and                            oxygen saturations were monitored continuously. The                            Colonoscope was introduced through the anus and                            advanced to the  the cecum, identified by                            appendiceal orifice and ileocecal valve. The                            colonoscopy was performed without difficulty. The                            patient tolerated the procedure well. The quality                            of the bowel preparation was excellent. The bowel                            preparation used was Miralax. The ileocecal valve,                            appendiceal orifice, and rectum were photographed. Scope In: 8:15:44 AM Scope Out: 8:34:43 AM Scope Withdrawal Time: 0 hours 15 minutes 34 seconds  Total Procedure Duration: 0 hours 18 minutes 59 seconds  Findings:                 The perianal and digital rectal examinations were                            normal. Pertinent negatives include normal prostate                            (size, shape, and consistency).                           Eight sessile polyps were found in the sigmoid                            colon, descending colon, transverse colon and                            cecum. The polyps were 3 to 5 mm in size. These  polyps were removed with a cold snare. Resection                            and retrieval were complete.                           A diffuse area of severely melanotic mucosa was                            found in the entire colon.                           Internal hemorrhoids were found.                           The exam was otherwise without abnormality on                            direct and retroflexion views. Complications:            No immediate complications. Estimated Blood Loss:     Estimated blood loss was minimal. Impression:               - Eight 3 to 5 mm polyps in the sigmoid colon, in                            the descending colon, in the transverse colon and                            in the cecum, removed with a cold snare. Resected                            and retrieved.                            - Melanotic mucosa in the entire examined colon.                           - Internal hemorrhoids.                           - The examination was otherwise normal on direct                            and retroflexion views.                           - Personal history of colonic polyps adenomas. Recommendation:           - Patient has a contact number available for                            emergencies. The signs and symptoms of potential                            delayed complications were discussed with the  patient. Return to normal activities tomorrow.                            Written discharge instructions were provided to the                            patient.                           - Resume previous diet.                           - Continue present medications.                           - Repeat colonoscopy is recommended for                            surveillance. The colonoscopy date will be                            determined after pathology results from today's                            exam become available for review. Gatha Mayer, MD 07/04/2017 8:45:21 AM This report has been signed electronically.

## 2017-07-04 NOTE — Progress Notes (Signed)
Pt's states no medical or surgical changes since previsit or office visit. 

## 2017-07-05 ENCOUNTER — Telehealth: Payer: Self-pay

## 2017-07-05 NOTE — Telephone Encounter (Signed)
  Follow up Call-  Call back number 07/04/2017  Post procedure Call Back phone  # [phone]  Permission to leave phone message Yes  Some recent data might be hidden     Patient questions:  Do you have a fever, pain , or abdominal swelling? No. Pain Score  0 *  Have you tolerated food without any problems? Yes.    Have you been able to return to your normal activities? Yes.    Do you have any questions about your discharge instructions: Diet   No. Medications  No. Follow up visit  No.  Do you have questions or concerns about your Care? No.  Actions: * If pain score is 4 or above: No action needed, pain <4.

## 2017-07-13 ENCOUNTER — Encounter: Payer: Self-pay | Admitting: Internal Medicine

## 2017-07-13 NOTE — Progress Notes (Signed)
8 adenomas Recall colonoscopy 2022 My Chart letter

## 2017-07-22 DIAGNOSIS — R350 Frequency of micturition: Secondary | ICD-10-CM | POA: Diagnosis not present

## 2017-08-29 DIAGNOSIS — R350 Frequency of micturition: Secondary | ICD-10-CM | POA: Diagnosis not present

## 2017-09-04 DIAGNOSIS — R69 Illness, unspecified: Secondary | ICD-10-CM | POA: Diagnosis not present

## 2017-10-03 DIAGNOSIS — R350 Frequency of micturition: Secondary | ICD-10-CM | POA: Diagnosis not present

## 2017-10-10 ENCOUNTER — Other Ambulatory Visit: Payer: Self-pay | Admitting: Family Medicine

## 2017-11-01 ENCOUNTER — Ambulatory Visit (INDEPENDENT_AMBULATORY_CARE_PROVIDER_SITE_OTHER): Payer: Medicare HMO

## 2017-11-01 ENCOUNTER — Other Ambulatory Visit: Payer: Medicare HMO

## 2017-11-01 DIAGNOSIS — Z8042 Family history of malignant neoplasm of prostate: Secondary | ICD-10-CM | POA: Diagnosis not present

## 2017-11-01 DIAGNOSIS — Z23 Encounter for immunization: Secondary | ICD-10-CM

## 2017-11-01 DIAGNOSIS — E7800 Pure hypercholesterolemia, unspecified: Secondary | ICD-10-CM

## 2017-11-01 DIAGNOSIS — I10 Essential (primary) hypertension: Secondary | ICD-10-CM | POA: Diagnosis not present

## 2017-11-01 DIAGNOSIS — Z125 Encounter for screening for malignant neoplasm of prostate: Secondary | ICD-10-CM | POA: Diagnosis not present

## 2017-11-01 DIAGNOSIS — E782 Mixed hyperlipidemia: Secondary | ICD-10-CM

## 2017-11-01 DIAGNOSIS — Z79899 Other long term (current) drug therapy: Secondary | ICD-10-CM

## 2017-11-01 LAB — LIPID PANEL
CHOL/HDL RATIO: 3.2 (calc) (ref <5.0)
CHOLESTEROL: 126 mg/dL (ref <200)
HDL: 39 mg/dL — ABNORMAL LOW (ref >40)
LDL CHOLESTEROL (CALC): 66 mg/dL
Non-HDL Cholesterol (Calc): 87 mg/dL (calc) (ref <130)
TRIGLYCERIDES: 130 mg/dL (ref <150)

## 2017-11-01 LAB — COMPREHENSIVE METABOLIC PANEL
AG Ratio: 2 (calc) (ref 1.0–2.5)
ALBUMIN MSPROF: 4.4 g/dL (ref 3.6–5.1)
ALKALINE PHOSPHATASE (APISO): 43 U/L (ref 40–115)
ALT: 24 U/L (ref 9–46)
AST: 23 U/L (ref 10–35)
BUN: 17 mg/dL (ref 7–25)
CO2: 23 mmol/L (ref 20–32)
CREATININE: 1.08 mg/dL (ref 0.70–1.18)
Calcium: 9.4 mg/dL (ref 8.6–10.3)
Chloride: 107 mmol/L (ref 98–110)
Globulin: 2.2 g/dL (calc) (ref 1.9–3.7)
Glucose, Bld: 91 mg/dL (ref 65–99)
POTASSIUM: 4.1 mmol/L (ref 3.5–5.3)
Sodium: 140 mmol/L (ref 135–146)
Total Bilirubin: 0.7 mg/dL (ref 0.2–1.2)
Total Protein: 6.6 g/dL (ref 6.1–8.1)

## 2017-11-01 LAB — CBC WITH DIFFERENTIAL/PLATELET
Basophils Absolute: 51 cells/uL (ref 0–200)
Basophils Relative: 0.7 %
EOS ABS: 270 {cells}/uL (ref 15–500)
Eosinophils Relative: 3.7 %
HCT: 45.9 % (ref 38.5–50.0)
Hemoglobin: 15.7 g/dL (ref 13.2–17.1)
Lymphs Abs: 2088 cells/uL (ref 850–3900)
MCH: 30.9 pg (ref 27.0–33.0)
MCHC: 34.2 g/dL (ref 32.0–36.0)
MCV: 90.4 fL (ref 80.0–100.0)
MONOS PCT: 8.4 %
MPV: 12.2 fL (ref 7.5–12.5)
NEUTROS PCT: 58.6 %
Neutro Abs: 4278 cells/uL (ref 1500–7800)
Platelets: 214 10*3/uL (ref 140–400)
RBC: 5.08 10*6/uL (ref 4.20–5.80)
RDW: 12.6 % (ref 11.0–15.0)
Total Lymphocyte: 28.6 %
WBC mixed population: 613 cells/uL (ref 200–950)
WBC: 7.3 10*3/uL (ref 3.8–10.8)

## 2017-11-01 LAB — PSA: PSA: 1.3 ng/mL (ref <=4.0)

## 2017-11-01 NOTE — Progress Notes (Signed)
Patient was in office for high dose flu vaccine.Patient received vaccine in his left deltoid.Patient tolerated well  

## 2017-11-07 DIAGNOSIS — R350 Frequency of micturition: Secondary | ICD-10-CM | POA: Diagnosis not present

## 2017-11-08 ENCOUNTER — Ambulatory Visit (INDEPENDENT_AMBULATORY_CARE_PROVIDER_SITE_OTHER): Payer: Medicare HMO | Admitting: Family Medicine

## 2017-11-08 ENCOUNTER — Encounter: Payer: Self-pay | Admitting: Family Medicine

## 2017-11-08 VITALS — BP 114/74 | HR 80 | Temp 97.6°F | Resp 16 | Ht 72.0 in | Wt 217.0 lb

## 2017-11-08 DIAGNOSIS — Z0000 Encounter for general adult medical examination without abnormal findings: Secondary | ICD-10-CM | POA: Diagnosis not present

## 2017-11-08 DIAGNOSIS — Z125 Encounter for screening for malignant neoplasm of prostate: Secondary | ICD-10-CM

## 2017-11-08 DIAGNOSIS — E7800 Pure hypercholesterolemia, unspecified: Secondary | ICD-10-CM | POA: Diagnosis not present

## 2017-11-08 DIAGNOSIS — I10 Essential (primary) hypertension: Secondary | ICD-10-CM

## 2017-11-08 MED ORDER — PANTOPRAZOLE SODIUM 40 MG PO TBEC: 40.0000 mg | DELAYED_RELEASE_TABLET | Freq: Every day | ORAL | 3 refills | Status: DC

## 2017-11-08 NOTE — Progress Notes (Signed)
Subjective:    Patient ID: Michael Nguyen, male    DOB: August 11, 1940, 77 y.o.   MRN: [id number]  HPI  Patient is here today for complete physical exam.  His last colonoscopy was performed this summer.  It did have a precancerous polyp and is recommended that he have one more colonoscopy in 3 years.  His PSA is documented below and has been stable and in normal range over the last several years.  His immunizations are listed below and are up-to-date except for shingrix.  He denies any falls or depression.  He denies any memory loss or problems with executive functioning and ADLs. Immunization History  Administered Date(s) Administered  . Influenza, High Dose Seasonal PF 11/01/2017  . Influenza,inj,Quad PF,6+ Mos 10/15/2012, 10/20/2013, 09/28/2014, 10/08/2016  . Pneumococcal Conjugate-13 09/14/2013  . Pneumococcal Polysaccharide-23 11/06/2005  . Td 10/03/2004  . Zoster 12/03/2006   Patient does report a cough every morning when he wakes up and increasing acid reflux.  He also complains of right knee pain that feels like an ice pick in the medial joint line posteriorly particular when he sits for long period of time such as driving a car.  It is made worse by standing and walking.  The pain began suddenly when he twisted his knee trying to work on the sink at home.  His exam and history is concerning for a meniscal tear and/or osteoarthritis Lab on 11/01/2017  Component Date Value Ref Range Status  . Glucose, Bld 11/01/2017 91  65 - 99 mg/dL Final   Comment: .            Fasting reference interval .   . BUN 11/01/2017 17  7 - 25 mg/dL Final  . Creat 11/01/2017 1.08  0.70 - 1.18 mg/dL Final   Comment: For patients >35 years of age, the reference limit for Creatinine is approximately 13% higher for people identified as African-American. .   Havery Moros Ratio 38/18/2993 NOT APPLICABLE  6 - 22 (calc) Final  . Sodium 11/01/2017 140  135 - 146 mmol/L Final  . Potassium 11/01/2017 4.1  3.5  - 5.3 mmol/L Final  . Chloride 11/01/2017 107  98 - 110 mmol/L Final  . CO2 11/01/2017 23  20 - 32 mmol/L Final  . Calcium 11/01/2017 9.4  8.6 - 10.3 mg/dL Final  . Total Protein 11/01/2017 6.6  6.1 - 8.1 g/dL Final  . Albumin 11/01/2017 4.4  3.6 - 5.1 g/dL Final  . Globulin 11/01/2017 2.2  1.9 - 3.7 g/dL (calc) Final  . AG Ratio 11/01/2017 2.0  1.0 - 2.5 (calc) Final  . Total Bilirubin 11/01/2017 0.7  0.2 - 1.2 mg/dL Final  . Alkaline phosphatase (APISO) 11/01/2017 43  40 - 115 U/L Final  . AST 11/01/2017 23  10 - 35 U/L Final  . ALT 11/01/2017 24  9 - 46 U/L Final  . Cholesterol 11/01/2017 126  <200 mg/dL Final  . HDL 11/01/2017 39* >40 mg/dL Final  . Triglycerides 11/01/2017 130  <150 mg/dL Final  . LDL Cholesterol (Calc) 11/01/2017 66  mg/dL (calc) Final   Comment: Reference range: <100 . Desirable range <100 mg/dL for primary prevention;   <70 mg/dL for patients with CHD or diabetic patients  with > or = 2 CHD risk factors. Marland Kitchen LDL-C is now calculated using the Martin-Hopkins  calculation, which is a validated novel method providing  better accuracy than the Friedewald equation in the  estimation of LDL-C.  Cresenciano Genre et  al. JAMA. 6283;151(76): 2061-2068  (http://education.[facility].com/faq/FAQ164)   . Total CHOL/HDL Ratio 11/01/2017 3.2  <5.0 (calc) Final  . Non-HDL Cholesterol (Calc) 11/01/2017 87  <130 mg/dL (calc) Final   Comment: For patients with diabetes plus 1 major ASCVD risk  factor, treating to a non-HDL-C goal of <100 mg/dL  (LDL-C of <70 mg/dL) is considered a therapeutic  option.   . WBC 11/01/2017 7.3  3.8 - 10.8 Thousand/uL Final  . RBC 11/01/2017 5.08  4.20 - 5.80 Million/uL Final  . Hemoglobin 11/01/2017 15.7  13.2 - 17.1 g/dL Final  . HCT 11/01/2017 45.9  38.5 - 50.0 % Final  . MCV 11/01/2017 90.4  80.0 - 100.0 fL Final  . MCH 11/01/2017 30.9  27.0 - 33.0 pg Final  . MCHC 11/01/2017 34.2  32.0 - 36.0 g/dL Final  . RDW 11/01/2017 12.6  11.0 - 15.0  % Final  . Platelets 11/01/2017 214  140 - 400 Thousand/uL Final  . MPV 11/01/2017 12.2  7.5 - 12.5 fL Final  . Neutro Abs 11/01/2017 4,278  1,500 - 7,800 cells/uL Final  . Lymphs Abs 11/01/2017 2,088  850 - 3,900 cells/uL Final  . WBC mixed population 11/01/2017 613  200 - 950 cells/uL Final  . Eosinophils Absolute 11/01/2017 270  15 - 500 cells/uL Final  . Basophils Absolute 11/01/2017 51  0 - 200 cells/uL Final  . Neutrophils Relative % 11/01/2017 58.6  % Final  . Total Lymphocyte 11/01/2017 28.6  % Final  . Monocytes Relative 11/01/2017 8.4  % Final  . Eosinophils Relative 11/01/2017 3.7  % Final  . Basophils Relative 11/01/2017 0.7  % Final  . PSA 11/01/2017 1.3  < OR = 4.0 ng/mL Final   Comment: The total PSA value from this assay system is  standardized against the WHO standard. The test  result will be approximately 20% lower when compared  to the equimolar-standardized total PSA (Beckman  Coulter). Comparison of serial PSA results should be  interpreted with this fact in mind. . This test was performed using the Siemens  chemiluminescent method. Values obtained from  different assay methods cannot be used interchangeably. PSA levels, regardless of value, should not be interpreted as absolute evidence of the presence or absence of disease.     Subjective:    Review Past Medical/Family/Social: Past Medical History:  Diagnosis Date  . Allergy   . Arthritis    "some" per pt  . BPH (benign prostatic hypertrophy)   . Cancer ([place])    skin- basal and squamous cell CA  . Cataract   . GERD (gastroesophageal reflux disease)   . History of ETT    with dr. Acie Fredrickson was normal per patient  . Hyperlipidemia   . Hypertension    Past Surgical History:  Procedure Laterality Date  . cataracts     both eyes  . COLONOSCOPY    . TONSILLECTOMY     Current Outpatient Medications on File Prior to Visit  Medication Sig Dispense Refill  . ALOE VERA PO Take by mouth daily.    Marland Kitchen  aspirin 81 MG tablet Take 81 mg by mouth daily.      Marland Kitchen atorvastatin (LIPITOR) 40 MG tablet TAKE ONE TABLET BY MOUTH ONCE DAILY 90 tablet 3  . Cholecalciferol (VITAMIN D3) 1000 UNITS CAPS Take 1 capsule by mouth 2 (two) times daily.      . fexofenadine (ALLEGRA) 180 MG tablet Take 180 mg by mouth daily.    . fish oil-omega-3 fatty  acids 1000 MG capsule Take 1 g by mouth 2 (two) times daily.      . fluticasone (FLONASE) 50 MCG/ACT nasal spray Place 2 sprays into both nostrils daily.    Marland Kitchen GLUCOSAMINE-CHONDROITIN-VIT C PO Take 2,000 mg by mouth daily.      Marland Kitchen losartan (COZAAR) 100 MG tablet TAKE 1/2 (ONE-HALF) TABLET BY MOUTH ONCE DAILY 45 tablet 5  . Multiple Vitamin (MULTIVITAMIN) tablet Take 1 tablet by mouth daily.      . naproxen sodium (ALEVE) 220 MG tablet Take 220 mg by mouth as needed.       No current facility-administered medications on file prior to visit.    Allergies  Allergen Reactions  . Prednisone     REACTION: ? intolerance per patient / Cramps   Social History   Socioeconomic History  . Marital status: Married    Spouse name: Not on file  . Number of children: 2  . Years of education: Not on file  . Highest education level: Not on file  Occupational History  . Occupation: Manufacturing systems engineer  . Financial resource strain: Not on file  . Food insecurity:    Worry: Not on file    Inability: Not on file  . Transportation needs:    Medical: Not on file    Non-medical: Not on file  Tobacco Use  . Smoking status: Former Smoker    Last attempt to quit: 01/23/1967    Years since quitting: 50.8  . Smokeless tobacco: Never Used  Substance and Sexual Activity  . Alcohol use: No  . Drug use: No  . Sexual activity: Not on file  Lifestyle  . Physical activity:    Days per week: Not on file    Minutes per session: Not on file  . Stress: Not on file  Relationships  . Social connections:    Talks on phone: Not on file    Gets together: Not on file    Attends  religious service: Not on file    Active member of club or organization: Not on file    Attends meetings of clubs or organizations: Not on file    Relationship status: Not on file  . Intimate partner violence:    Fear of current or ex partner: Not on file    Emotionally abused: Not on file    Physically abused: Not on file    Forced sexual activity: Not on file  Other Topics Concern  . Not on file  Social History Narrative   [place]   Family History  Problem Relation Age of Onset  . Coronary artery disease Brother        x2. One had CABG at 68. other with CABG in his 60's. Father with MI in his 80's  . Esophageal cancer Father   . Breast cancer Mother   . Colon cancer Neg Hx   . Rectal cancer Neg Hx   . Stomach cancer Neg Hx   . Colon polyps Neg Hx      Risk Factors  Current exercise habits:  Dietary issues discussed:   Cardiac risk factors: Obesity (BMI >= 30 kg/m2).   Depression Screen  (Note: if answer to either of the following is "Yes", a more complete depression screening is indicated)  Over the past two weeks, have you felt down, depressed or hopeless? No Over the past two weeks, have you felt little interest or pleasure in doing things? No Have you lost interest  or pleasure in daily life? No Do you often feel hopeless? No Do you cry easily over simple problems? No   Activities of Daily Living  In your present state of health, do you have any difficulty performing the following activities?:  Driving? No  Managing money? No  Feeding yourself? No  Getting from bed to chair? No  Climbing a flight of stairs? No  Preparing food and eating?: No  Bathing or showering? No  Getting dressed: No  Getting to the toilet? No  Using the toilet:No  Moving around from place to place: No  In the past year have you fallen or had a near fall?:No  Are you sexually active? Yes Do you have more than one partner? No   Hearing Difficulties: No  Do you often ask  people to speak up or repeat themselves? No  Do you experience ringing or noises in your ears? No Do you have difficulty understanding soft or whispered voices? No  Do you feel that you have a problem with memory? No Do you often misplace items? No  Do you feel safe at home? Yes  Cognitive Testing  Alert? Yes Normal Appearance?Yes  Oriented to person? Yes Place? Yes  Time? Yes  Recall of three objects? Yes  Can perform simple calculations? Yes  Displays appropriate judgment?Yes  Can read the correct time from a watch face?Yes   Review of Systems  All other systems reviewed and are negative.      Objective:   Physical Exam  Constitutional: He is oriented to person, place, and time. He appears well-developed and well-nourished. No distress.  HENT:  Head: Normocephalic and atraumatic.  Right Ear: External ear normal.  Left Ear: External ear normal.  Nose: Nose normal.  Mouth/Throat: Oropharynx is clear and moist. No oropharyngeal exudate.  Eyes: Pupils are equal, round, and reactive to light. Conjunctivae and EOM are normal. Right eye exhibits no discharge. Left eye exhibits no discharge. No scleral icterus.  Neck: Normal range of motion. Neck supple. No JVD present. No tracheal deviation present. No thyromegaly present.  Cardiovascular: Normal rate, regular rhythm, normal heart sounds and intact distal pulses. Exam reveals no gallop and no friction rub.  No murmur heard. Pulmonary/Chest: Effort normal and breath sounds normal. No stridor. No respiratory distress. He has no wheezes. He has no rales. He exhibits no tenderness.  Abdominal: Soft. Bowel sounds are normal. He exhibits no distension and no mass. There is no tenderness. There is no rebound and no guarding.  Genitourinary: Rectum normal. Prostate is not enlarged and not tender.  Musculoskeletal: Normal range of motion. He exhibits no edema or tenderness.  Lymphadenopathy:    He has no cervical adenopathy.  Neurological:  He is alert and oriented to person, place, and time. He has normal reflexes. No cranial nerve deficit. He exhibits normal muscle tone. Coordination normal.  Skin: Skin is warm. No rash noted. He is not diaphoretic. No erythema. No pallor.  Psychiatric: He has a normal mood and affect. His behavior is normal. Judgment and thought content normal.  Vitals reviewed.         Assessment & Plan:  Routine general medical examination at a health care facility  Benign essential HTN  Pure hypercholesterolemia  Prostate cancer screening Patient's physical exam is normal. His lab work is excellent. The patient's colonoscopy is up-to-date. PSA is within normal limits. His cholesterol is outstanding.  Commended the patient try Protonix 40 mg a day for acid reflux.  I believe  the acid reflux may be causing his early morning cough.  I recommended using Aleve sparingly for his right knee pain.  If not improving, I would recommend a cortisone injection for possible meniscal tear coupled with osteoarthritis.  Recommended the shingles vaccine if reasonably priced.  The remainder of his preventative care is up-to-date.

## 2017-11-29 ENCOUNTER — Other Ambulatory Visit: Payer: Self-pay | Admitting: Family Medicine

## 2017-12-12 DIAGNOSIS — R350 Frequency of micturition: Secondary | ICD-10-CM | POA: Diagnosis not present

## 2017-12-16 DIAGNOSIS — L309 Dermatitis, unspecified: Secondary | ICD-10-CM | POA: Diagnosis not present

## 2017-12-24 ENCOUNTER — Ambulatory Visit: Payer: Medicare HMO | Admitting: Family Medicine

## 2018-01-17 DIAGNOSIS — R350 Frequency of micturition: Secondary | ICD-10-CM | POA: Diagnosis not present

## 2018-02-03 DIAGNOSIS — H35363 Drusen (degenerative) of macula, bilateral: Secondary | ICD-10-CM | POA: Diagnosis not present

## 2018-02-03 DIAGNOSIS — H35033 Hypertensive retinopathy, bilateral: Secondary | ICD-10-CM | POA: Diagnosis not present

## 2018-02-03 DIAGNOSIS — H1851 Endothelial corneal dystrophy: Secondary | ICD-10-CM | POA: Diagnosis not present

## 2018-02-03 DIAGNOSIS — H35373 Puckering of macula, bilateral: Secondary | ICD-10-CM | POA: Diagnosis not present

## 2018-02-03 DIAGNOSIS — H43812 Vitreous degeneration, left eye: Secondary | ICD-10-CM | POA: Diagnosis not present

## 2018-02-11 ENCOUNTER — Encounter: Payer: Self-pay | Admitting: *Deleted

## 2018-02-17 DIAGNOSIS — H1851 Endothelial corneal dystrophy: Secondary | ICD-10-CM | POA: Diagnosis not present

## 2018-02-17 DIAGNOSIS — H02833 Dermatochalasis of right eye, unspecified eyelid: Secondary | ICD-10-CM | POA: Diagnosis not present

## 2018-02-21 DIAGNOSIS — R350 Frequency of micturition: Secondary | ICD-10-CM | POA: Diagnosis not present

## 2018-03-10 ENCOUNTER — Other Ambulatory Visit: Payer: Self-pay | Admitting: Family Medicine

## 2018-03-14 DIAGNOSIS — L99 Other disorders of skin and subcutaneous tissue in diseases classified elsewhere: Secondary | ICD-10-CM | POA: Diagnosis not present

## 2018-03-14 DIAGNOSIS — D485 Neoplasm of uncertain behavior of skin: Secondary | ICD-10-CM | POA: Diagnosis not present

## 2018-03-14 DIAGNOSIS — L570 Actinic keratosis: Secondary | ICD-10-CM | POA: Diagnosis not present

## 2018-03-14 DIAGNOSIS — E854 Organ-limited amyloidosis: Secondary | ICD-10-CM | POA: Diagnosis not present

## 2018-03-14 DIAGNOSIS — D225 Melanocytic nevi of trunk: Secondary | ICD-10-CM | POA: Diagnosis not present

## 2018-03-14 DIAGNOSIS — D2261 Melanocytic nevi of right upper limb, including shoulder: Secondary | ICD-10-CM | POA: Diagnosis not present

## 2018-03-14 DIAGNOSIS — Z85828 Personal history of other malignant neoplasm of skin: Secondary | ICD-10-CM | POA: Diagnosis not present

## 2018-03-14 DIAGNOSIS — D2262 Melanocytic nevi of left upper limb, including shoulder: Secondary | ICD-10-CM | POA: Diagnosis not present

## 2018-03-14 DIAGNOSIS — S30861A Insect bite (nonvenomous) of abdominal wall, initial encounter: Secondary | ICD-10-CM | POA: Diagnosis not present

## 2018-03-14 DIAGNOSIS — L821 Other seborrheic keratosis: Secondary | ICD-10-CM | POA: Diagnosis not present

## 2018-03-28 DIAGNOSIS — R350 Frequency of micturition: Secondary | ICD-10-CM | POA: Diagnosis not present

## 2018-04-16 ENCOUNTER — Other Ambulatory Visit: Payer: Self-pay

## 2018-04-16 ENCOUNTER — Ambulatory Visit (INDEPENDENT_AMBULATORY_CARE_PROVIDER_SITE_OTHER): Payer: Medicare HMO | Admitting: Family Medicine

## 2018-04-16 DIAGNOSIS — J309 Allergic rhinitis, unspecified: Secondary | ICD-10-CM | POA: Diagnosis not present

## 2018-04-16 MED ORDER — PREDNISONE 20 MG PO TABS: ORAL_TABLET | ORAL | 0 refills | Status: DC

## 2018-04-16 NOTE — Progress Notes (Signed)
Subjective:    Patient ID: Michael Nguyen, male    DOB: Apr 15, 1940, 78 y.o.   MRN: [id number]  HPI Patient is a very pleasant 78 year old Caucasian male being seen today via telephone.  The patient consents to a telephone visit.  He is currently at home.  I am currently in my office.  Phone call began at 9:05 AM.  Patient states that symptoms have been going on now for several months.  However it has recently worsened with the season change.  As the trees of started blooming, the symptoms have worsened.  He states that he has had head congestion on a daily basis for the last 2 months.  When he wakes up in the morning, he is blowing out copious amounts of clear and yellow mucus.  He is currently on Flonase and has been taking that the entire time.  He is also been on Allegra and taking that daily.  He even started Singulair a week ago and has been taking that as well.  Symptoms are not improving.  In fact they are worsening.  However he denies any fever.  He denies any chills.  He denies any pain or pressure behind his eyes or in his cheeks or in his teeth.  He denies any cough or postnasal drip.  He denies any shortness of breath or chest pain.  He denies any sick contacts.  Differential diagnosis is seasonal allergies versus chronic sinus infection. Past Medical History:  Diagnosis Date  . Allergy   . Arthritis    "some" per pt  . BPH (benign prostatic hypertrophy)   . Cancer ([place])    skin- basal and squamous cell CA  . Cataract   . GERD (gastroesophageal reflux disease)   . History of ETT    with dr. Acie Fredrickson was normal per patient  . Hyperlipidemia   . Hypertension    Past Surgical History:  Procedure Laterality Date  . cataracts     both eyes  . COLONOSCOPY    . TONSILLECTOMY     Current Outpatient Medications on File Prior to Visit  Medication Sig Dispense Refill  . ALOE VERA PO Take by mouth daily.    Marland Kitchen aspirin 81 MG tablet Take 81 mg by mouth daily.      Marland Kitchen atorvastatin  (LIPITOR) 40 MG tablet TAKE 1 TABLET BY MOUTH ONCE DAILY 90 tablet 3  . Cholecalciferol (VITAMIN D3) 1000 UNITS CAPS Take 1 capsule by mouth 2 (two) times daily.      . fexofenadine (ALLEGRA) 180 MG tablet Take 180 mg by mouth daily.    . fish oil-omega-3 fatty acids 1000 MG capsule Take 1 g by mouth 2 (two) times daily.      . fluticasone (FLONASE) 50 MCG/ACT nasal spray Place 2 sprays into both nostrils daily.    Marland Kitchen GLUCOSAMINE-CHONDROITIN-VIT C PO Take 2,000 mg by mouth daily.      Marland Kitchen losartan (COZAAR) 100 MG tablet TAKE 1/2 (ONE-HALF) TABLET BY MOUTH ONCE DAILY 45 tablet 5  . Multiple Vitamin (MULTIVITAMIN) tablet Take 1 tablet by mouth daily.      . naproxen sodium (ALEVE) 220 MG tablet Take 220 mg by mouth as needed.      . pantoprazole (PROTONIX) 40 MG tablet TAKE 1 TABLET BY MOUTH DAILY 90 tablet 3   No current facility-administered medications on file prior to visit.    Allergies  Allergen Reactions  . Prednisone     REACTION: ? intolerance per patient /  Cramps   Social History   Socioeconomic History  . Marital status: Married    Spouse name: Not on file  . Number of children: 2  . Years of education: Not on file  . Highest education level: Not on file  Occupational History  . Occupation: Manufacturing systems engineer  . Financial resource strain: Not on file  . Food insecurity:    Worry: Not on file    Inability: Not on file  . Transportation needs:    Medical: Not on file    Non-medical: Not on file  Tobacco Use  . Smoking status: Former Smoker    Last attempt to quit: 01/23/1967    Years since quitting: 51.2  . Smokeless tobacco: Never Used  Substance and Sexual Activity  . Alcohol use: No  . Drug use: No  . Sexual activity: Not on file  Lifestyle  . Physical activity:    Days per week: Not on file    Minutes per session: Not on file  . Stress: Not on file  Relationships  . Social connections:    Talks on phone: Not on file    Gets together: Not on file     Attends religious service: Not on file    Active member of club or organization: Not on file    Attends meetings of clubs or organizations: Not on file    Relationship status: Not on file  . Intimate partner violence:    Fear of current or ex partner: Not on file    Emotionally abused: Not on file    Physically abused: Not on file    Forced sexual activity: Not on file  Other Topics Concern  . Not on file  Social History Narrative   Lives in [place]  All other systems reviewed and are negative.      Objective:   Physical Exam  No physical exam was performed today as the patient was seen via telephone visit      Assessment & Plan:  Allergic rhinitis, unspecified seasonality, unspecified trigger  Differential diagnosis includes chronic subacute sinusitis versus seasonal allergies.  Patient has tried and failed numerous preventative control medications including Allegra, Flonase, and Singulair which he is using simultaneously.  However he denies any pain or pressure in his sinuses.  He denies any headache or fevers.  Therefore I will treat the patient for allergies using a prednisone taper pack.  If symptoms do not improve, consider the addition of antibiotics for possible chronic sinusitis.  Patient will try prednisone initially and then will contact me in a week and let me know how his symptoms are doing.  He has a listed allergy to prednisone.  We discussed this.  He states that he took the medication many years ago.  After he took the medication he had muscle cramps in his legs.  He is not even sure that this was related to the prednisone and he is perfectly willing to try it again given the severity of his allergies right now.  Office visit was concluded at 9:16 AM.

## 2018-05-01 DIAGNOSIS — R350 Frequency of micturition: Secondary | ICD-10-CM | POA: Diagnosis not present

## 2018-05-19 ENCOUNTER — Other Ambulatory Visit: Payer: Self-pay

## 2018-05-19 ENCOUNTER — Ambulatory Visit (INDEPENDENT_AMBULATORY_CARE_PROVIDER_SITE_OTHER): Payer: Medicare HMO | Admitting: Family Medicine

## 2018-05-19 DIAGNOSIS — J321 Chronic frontal sinusitis: Secondary | ICD-10-CM | POA: Diagnosis not present

## 2018-05-19 MED ORDER — PREDNISONE 20 MG PO TABS: ORAL_TABLET | ORAL | 0 refills | Status: DC

## 2018-05-19 MED ORDER — AMOXICILLIN-POT CLAVULANATE 875-125 MG PO TABS: 1.0000 | ORAL_TABLET | Freq: Two times a day (BID) | ORAL | 0 refills | Status: DC

## 2018-05-19 NOTE — Progress Notes (Signed)
Subjective:    Patient ID: Michael Nguyen, male    DOB: [birth year], 78 y.o.   MRN: [id number]  HPI  04/16/18 Patient is a very pleasant 78 year old Caucasian male being seen today via telephone.  The patient consents to a telephone visit.  He is currently at home.  I am currently in my office.  Phone call began at 9:05 AM.  Patient states that symptoms have been going on now for several months.  However it has recently worsened with the season change.  As the trees of started blooming, the symptoms have worsened.  He states that he has had head congestion on a daily basis for the last 2 months.  When he wakes up in the morning, he is blowing out copious amounts of clear and yellow mucus.  He is currently on Flonase and has been taking that the entire time.  He is also been on Allegra and taking that daily.  He even started Singulair a week ago and has been taking that as well.  Symptoms are not improving.  In fact they are worsening.  However he denies any fever.  He denies any chills.  He denies any pain or pressure behind his eyes or in his cheeks or in his teeth.  He denies any cough or postnasal drip.  He denies any shortness of breath or chest pain.  He denies any sick contacts.  Differential diagnosis is seasonal allergies versus chronic sinus infection.  At that time, my plan was: Differential diagnosis includes chronic subacute sinusitis versus seasonal allergies.  Patient has tried and failed numerous preventative control medications including Allegra, Flonase, and Singulair which he is using simultaneously.  However he denies any pain or pressure in his sinuses.  He denies any headache or fevers.  Therefore I will treat the patient for allergies using a prednisone taper pack.  If symptoms do not improve, consider the addition of antibiotics for possible chronic sinusitis.  Patient will try prednisone initially and then will contact me in a week and let me know how his symptoms are doing.  He has a  listed allergy to prednisone.  We discussed this.  He states that he took the medication many years ago.  After he took the medication he had muscle cramps in his legs.  He is not even sure that this was related to the prednisone and he is perfectly willing to try it again given the severity of his allergies right now.  Office visit was concluded at 9:16 AM.  05/19/18 Patient is being seen today in follow-up.  He has been seen over the telephone.  He consents to be seen over the telephone.  He is currently at home.  I am currently in my office.  Phone call began at 1055.  Phone call ended at 1106.  Patient initially saw improvement when he took prednisone.  The head congestion and rhinorrhea especially improved.  However after he stopped the prednisone he developed a cough with postnasal drip that will not stop.  He will call 6 or 7 times an hour.  The cough is productive of clear or yellow or grayish mucus.  He denies any chest pain.  He denies any shortness of breath.  He denies any pleurisy.  He denies any fevers or chills or night sweats.  He does report an occasional constant pressure-like pain in his frontal sinuses.  He denies any maxillary sinus pain.  He denies any dental pain.  He is taking Singulair,  Zyrtec, and Flonase for sinusitis but again continues to have pain and pressure in his frontal sinuses. Past Medical History:  Diagnosis Date  . Allergy   . Arthritis    "some" per pt  . BPH (benign prostatic hypertrophy)   . Cancer ([place])    skin- basal and squamous cell CA  . Cataract   . GERD (gastroesophageal reflux disease)   . History of ETT    with dr. Acie Fredrickson was normal per patient  . Hyperlipidemia   . Hypertension    Past Surgical History:  Procedure Laterality Date  . cataracts     both eyes  . COLONOSCOPY    . TONSILLECTOMY     Current Outpatient Medications on File Prior to Visit  Medication Sig Dispense Refill  . ALOE VERA PO Take by mouth daily.    Marland Kitchen aspirin 81 MG  tablet Take 81 mg by mouth daily.      Marland Kitchen atorvastatin (LIPITOR) 40 MG tablet TAKE 1 TABLET BY MOUTH ONCE DAILY 90 tablet 3  . Cholecalciferol (VITAMIN D3) 1000 UNITS CAPS Take 1 capsule by mouth 2 (two) times daily.      . fexofenadine (ALLEGRA) 180 MG tablet Take 180 mg by mouth daily.    . fish oil-omega-3 fatty acids 1000 MG capsule Take 1 g by mouth 2 (two) times daily.      . fluticasone (FLONASE) 50 MCG/ACT nasal spray Place 2 sprays into both nostrils daily.    Marland Kitchen GLUCOSAMINE-CHONDROITIN-VIT C PO Take 2,000 mg by mouth daily.      Marland Kitchen losartan (COZAAR) 100 MG tablet TAKE 1/2 (ONE-HALF) TABLET BY MOUTH ONCE DAILY 45 tablet 5  . Multiple Vitamin (MULTIVITAMIN) tablet Take 1 tablet by mouth daily.      . naproxen sodium (ALEVE) 220 MG tablet Take 220 mg by mouth as needed.      . pantoprazole (PROTONIX) 40 MG tablet TAKE 1 TABLET BY MOUTH DAILY 90 tablet 3  . predniSONE (DELTASONE) 20 MG tablet 3 tabs poqday 1-2, 2 tabs poqday 3-4, 1 tab poqday 5-6 12 tablet 0   No current facility-administered medications on file prior to visit.    Allergies  Allergen Reactions  . Prednisone     REACTION: ? intolerance per patient / Cramps   Social History   Socioeconomic History  . Marital status: Married    Spouse name: Not on file  . Number of children: 2  . Years of education: Not on file  . Highest education level: Not on file  Occupational History  . Occupation: Manufacturing systems engineer  . Financial resource strain: Not on file  . Food insecurity:    Worry: Not on file    Inability: Not on file  . Transportation needs:    Medical: Not on file    Non-medical: Not on file  Tobacco Use  . Smoking status: Former Smoker    Last attempt to quit: 01/23/1967    Years since quitting: 51.3  . Smokeless tobacco: Never Used  Substance and Sexual Activity  . Alcohol use: No  . Drug use: No  . Sexual activity: Not on file  Lifestyle  . Physical activity:    Days per week: Not on file     Minutes per session: Not on file  . Stress: Not on file  Relationships  . Social connections:    Talks on phone: Not on file    Gets together: Not on file    Attends religious service:  Not on file    Active member of club or organization: Not on file    Attends meetings of clubs or organizations: Not on file    Relationship status: Not on file  . Intimate partner violence:    Fear of current or ex partner: Not on file    Emotionally abused: Not on file    Physically abused: Not on file    Forced sexual activity: Not on file  Other Topics Concern  . Not on file  Social History Narrative   Lives in [place]  All other systems reviewed and are negative.      Objective:   Physical Exam  No physical exam was performed today as the patient was seen via telephone visit      Assessment & Plan:  Chronic frontal sinusitis  I suspect that the patient has a chronic sinus infection due to his allergies particular given the pain and pressure in his frontal sinuses.  I believe this is likely causing postnasal drip triggering the cough productive of clear and yellow mucus.  Continue Zyrtec.  Continue Flonase.  Continue Singulair.  He is already on Protonix for acid reflux.  However I will treat his sinus infection with Augmentin 875 mg p.o. twice daily for 10 days and a prednisone taper pack.  If no better at the end of this therapy, proceed with a chest x-ray.  Patient is comfortable with this plan.

## 2018-06-05 DIAGNOSIS — R350 Frequency of micturition: Secondary | ICD-10-CM | POA: Diagnosis not present

## 2018-07-10 DIAGNOSIS — R350 Frequency of micturition: Secondary | ICD-10-CM | POA: Diagnosis not present

## 2018-08-14 DIAGNOSIS — R350 Frequency of micturition: Secondary | ICD-10-CM | POA: Diagnosis not present

## 2018-09-03 DIAGNOSIS — R69 Illness, unspecified: Secondary | ICD-10-CM | POA: Diagnosis not present

## 2018-09-08 DIAGNOSIS — R69 Illness, unspecified: Secondary | ICD-10-CM | POA: Diagnosis not present

## 2018-09-18 DIAGNOSIS — R350 Frequency of micturition: Secondary | ICD-10-CM | POA: Diagnosis not present

## 2018-10-23 DIAGNOSIS — R350 Frequency of micturition: Secondary | ICD-10-CM | POA: Diagnosis not present

## 2018-10-24 ENCOUNTER — Other Ambulatory Visit: Payer: Medicare HMO

## 2018-10-24 ENCOUNTER — Ambulatory Visit (INDEPENDENT_AMBULATORY_CARE_PROVIDER_SITE_OTHER): Payer: Medicare HMO

## 2018-10-24 ENCOUNTER — Other Ambulatory Visit: Payer: Self-pay

## 2018-10-24 DIAGNOSIS — Z0000 Encounter for general adult medical examination without abnormal findings: Secondary | ICD-10-CM

## 2018-10-24 DIAGNOSIS — Z23 Encounter for immunization: Secondary | ICD-10-CM | POA: Diagnosis not present

## 2018-10-25 LAB — COMPREHENSIVE METABOLIC PANEL
AG Ratio: 2 (calc) (ref 1.0–2.5)
ALT: 27 U/L (ref 9–46)
AST: 24 U/L (ref 10–35)
Albumin: 4.3 g/dL (ref 3.6–5.1)
Alkaline phosphatase (APISO): 48 U/L (ref 35–144)
BUN: 21 mg/dL (ref 7–25)
CO2: 24 mmol/L (ref 20–32)
Calcium: 9.4 mg/dL (ref 8.6–10.3)
Chloride: 109 mmol/L (ref 98–110)
Creat: 1.09 mg/dL (ref 0.70–1.18)
Globulin: 2.2 g/dL (calc) (ref 1.9–3.7)
Glucose, Bld: 100 mg/dL — ABNORMAL HIGH (ref 65–99)
Potassium: 4.5 mmol/L (ref 3.5–5.3)
Sodium: 141 mmol/L (ref 135–146)
Total Bilirubin: 0.8 mg/dL (ref 0.2–1.2)
Total Protein: 6.5 g/dL (ref 6.1–8.1)

## 2018-10-25 LAB — CBC WITH DIFFERENTIAL/PLATELET
Absolute Monocytes: 655 cells/uL (ref 200–950)
Basophils Absolute: 67 cells/uL (ref 0–200)
Basophils Relative: 0.8 %
Eosinophils Absolute: 235 cells/uL (ref 15–500)
Eosinophils Relative: 2.8 %
HCT: 47.2 % (ref 38.5–50.0)
Hemoglobin: 15.8 g/dL (ref 13.2–17.1)
Lymphs Abs: 1932 cells/uL (ref 850–3900)
MCH: 31.1 pg (ref 27.0–33.0)
MCHC: 33.5 g/dL (ref 32.0–36.0)
MCV: 92.9 fL (ref 80.0–100.0)
MPV: 11.9 fL (ref 7.5–12.5)
Monocytes Relative: 7.8 %
Neutro Abs: 5510 cells/uL (ref 1500–7800)
Neutrophils Relative %: 65.6 %
Platelets: 207 10*3/uL (ref 140–400)
RBC: 5.08 10*6/uL (ref 4.20–5.80)
RDW: 12.6 % (ref 11.0–15.0)
Total Lymphocyte: 23 %
WBC: 8.4 10*3/uL (ref 3.8–10.8)

## 2018-10-25 LAB — LIPID PANEL
Cholesterol: 144 mg/dL (ref <200)
HDL: 43 mg/dL (ref >=40)
LDL Cholesterol (Calc): 80 mg/dL (calc)
Non-HDL Cholesterol (Calc): 101 mg/dL (calc) (ref <130)
Total CHOL/HDL Ratio: 3.3 (calc) (ref <5.0)
Triglycerides: 115 mg/dL (ref <150)

## 2018-10-25 LAB — PSA: PSA: 1.3 ng/mL (ref <=4.0)

## 2018-11-11 ENCOUNTER — Other Ambulatory Visit: Payer: Medicare HMO

## 2018-11-17 ENCOUNTER — Other Ambulatory Visit: Payer: Self-pay

## 2018-11-18 ENCOUNTER — Encounter: Payer: Self-pay | Admitting: Family Medicine

## 2018-11-18 ENCOUNTER — Ambulatory Visit (INDEPENDENT_AMBULATORY_CARE_PROVIDER_SITE_OTHER): Payer: Medicare HMO | Admitting: Family Medicine

## 2018-11-18 VITALS — BP 142/88 | HR 100 | Temp 98.2°F | Resp 16 | Ht 72.0 in | Wt 225.0 lb

## 2018-11-18 DIAGNOSIS — Z0001 Encounter for general adult medical examination with abnormal findings: Secondary | ICD-10-CM

## 2018-11-18 DIAGNOSIS — Z86010 Personal history of colonic polyps: Secondary | ICD-10-CM | POA: Diagnosis not present

## 2018-11-18 DIAGNOSIS — E7800 Pure hypercholesterolemia, unspecified: Secondary | ICD-10-CM | POA: Diagnosis not present

## 2018-11-18 DIAGNOSIS — I10 Essential (primary) hypertension: Secondary | ICD-10-CM

## 2018-11-18 DIAGNOSIS — Z125 Encounter for screening for malignant neoplasm of prostate: Secondary | ICD-10-CM

## 2018-11-18 DIAGNOSIS — Z0000 Encounter for general adult medical examination without abnormal findings: Secondary | ICD-10-CM

## 2018-11-18 NOTE — Progress Notes (Signed)
Subjective:    Patient ID: Michael Nguyen, male    DOB: [birth year], 78 y.o.   MRN: [id number]  HPI  Patient is here today for complete physical exam.  He denies any concerns.  His immunizations are up-to-date except for the Shingrix vaccine as well as tetanus shot.  He declines both of these today.  His last colonoscopy was performed in 2019 and he did have several polyps.  Repeat colonoscopy would be recommended when the patient is 82.  However he is doing well now with no concerns.  He had his PSA checked on his most recent lab work which was 1.3.  This shows no change from last year suggesting against any prostate cancer.  He denies any issues with falls, depression, or memory loss.  Most recent lab work is listed below and is excellent.  Blood pressure today is slightly elevated 142/88 Lab on 10/24/2018  Component Date Value Ref Range Status  . PSA 10/24/2018 1.3  < OR = 4.0 ng/mL Final   Comment: The total PSA value from this assay system is  standardized against the WHO standard. The test  result will be approximately 20% lower when compared  to the equimolar-standardized total PSA (Beckman  Coulter). Comparison of serial PSA results should be  interpreted with this fact in mind. . This test was performed using the Siemens  chemiluminescent method. Values obtained from  different assay methods cannot be used interchangeably. PSA levels, regardless of value, should not be interpreted as absolute evidence of the presence or absence of disease.   . Glucose, Bld 10/24/2018 100* 65 - 99 mg/dL Final   Comment: .            Fasting reference interval . For someone without known diabetes, a glucose value between 100 and 125 mg/dL is consistent with prediabetes and should be confirmed with a follow-up test. .   . BUN 10/24/2018 21  7 - 25 mg/dL Final  . Creat 10/24/2018 1.09  0.70 - 1.18 mg/dL Final   Comment: For patients >82 years of age, the reference limit for Creatinine is  approximately 13% higher for people identified as African-American. .   Havery Moros Ratio [id number] NOT APPLICABLE  6 - 22 (calc) Final  . Sodium 10/24/2018 141  135 - 146 mmol/L Final  . Potassium 10/24/2018 4.5  3.5 - 5.3 mmol/L Final  . Chloride 10/24/2018 109  98 - 110 mmol/L Final  . CO2 10/24/2018 24  20 - 32 mmol/L Final  . Calcium 10/24/2018 9.4  8.6 - 10.3 mg/dL Final  . Total Protein 10/24/2018 6.5  6.1 - 8.1 g/dL Final  . Albumin 10/24/2018 4.3  3.6 - 5.1 g/dL Final  . Globulin 10/24/2018 2.2  1.9 - 3.7 g/dL (calc) Final  . AG Ratio 10/24/2018 2.0  1.0 - 2.5 (calc) Final  . Total Bilirubin 10/24/2018 0.8  0.2 - 1.2 mg/dL Final  . Alkaline phosphatase (APISO) 10/24/2018 48  35 - 144 U/L Final  . AST 10/24/2018 24  10 - 35 U/L Final  . ALT 10/24/2018 27  9 - 46 U/L Final  . Cholesterol 10/24/2018 144  <200 mg/dL Final  . HDL 10/24/2018 43  > OR = 40 mg/dL Final  . Triglycerides 10/24/2018 115  <150 mg/dL Final  . LDL Cholesterol (Calc) 10/24/2018 80  mg/dL (calc) Final   Comment: Reference range: <100 . Desirable range <100 mg/dL for primary prevention;   <70 mg/dL for patients with CHD or  diabetic patients  with > or = 2 CHD risk factors. Marland Kitchen LDL-C is now calculated using the Martin-Hopkins  calculation, which is a validated novel method providing  better accuracy than the Friedewald equation in the  estimation of LDL-C.  Cresenciano Genre et al. Annamaria Helling. [id number]): 2061-2068  (http://education.[facility].com/faq/FAQ164)   . Total CHOL/HDL Ratio 10/24/2018 3.3  <5.0 (calc) Final  . Non-HDL Cholesterol (Calc) 10/24/2018 101  <130 mg/dL (calc) Final   Comment: For patients with diabetes plus 1 major ASCVD risk  factor, treating to a non-HDL-C goal of <100 mg/dL  (LDL-C of <70 mg/dL) is considered a therapeutic  option.   . WBC 10/24/2018 8.4  3.8 - 10.8 Thousand/uL Final  . RBC 10/24/2018 5.08  4.20 - 5.80 Million/uL Final  . Hemoglobin 10/24/2018 15.8  13.2 -  17.1 g/dL Final  . HCT 10/24/2018 47.2  38.5 - 50.0 % Final  . MCV 10/24/2018 92.9  80.0 - 100.0 fL Final  . MCH 10/24/2018 31.1  27.0 - 33.0 pg Final  . MCHC 10/24/2018 33.5  32.0 - 36.0 g/dL Final  . RDW 10/24/2018 12.6  11.0 - 15.0 % Final  . Platelets 10/24/2018 207  140 - 400 Thousand/uL Final  . MPV 10/24/2018 11.9  7.5 - 12.5 fL Final  . Neutro Abs 10/24/2018 5,510  1,500 - 7,800 cells/uL Final  . Lymphs Abs 10/24/2018 1,932  850 - 3,900 cells/uL Final  . Absolute Monocytes 10/24/2018 655  200 - 950 cells/uL Final  . Eosinophils Absolute 10/24/2018 235  15 - 500 cells/uL Final  . Basophils Absolute 10/24/2018 67  0 - 200 cells/uL Final  . Neutrophils Relative % 10/24/2018 65.6  % Final  . Total Lymphocyte 10/24/2018 23.0  % Final  . Monocytes Relative 10/24/2018 7.8  % Final  . Eosinophils Relative 10/24/2018 2.8  % Final  . Basophils Relative 10/24/2018 0.8  % Final    Subjective:    Review Past Medical/Family/Social: Past Medical History:  Diagnosis Date  . Allergy   . Arthritis    "some" per pt  . BPH (benign prostatic hypertrophy)   . Cancer ([place])    skin- basal and squamous cell CA  . Cataract   . GERD (gastroesophageal reflux disease)   . History of ETT    with dr. Acie Fredrickson was normal per patient  . Hyperlipidemia   . Hypertension    Past Surgical History:  Procedure Laterality Date  . cataracts     both eyes  . COLONOSCOPY    . TONSILLECTOMY     Current Outpatient Medications on File Prior to Visit  Medication Sig Dispense Refill  . ALOE VERA PO Take by mouth daily.    Marland Kitchen aspirin 81 MG tablet Take 81 mg by mouth daily.      Marland Kitchen atorvastatin (LIPITOR) 40 MG tablet TAKE 1 TABLET BY MOUTH ONCE DAILY 90 tablet 3  . Cholecalciferol (VITAMIN D3) 1000 UNITS CAPS Take 1 capsule by mouth 2 (two) times daily.      . fexofenadine (ALLEGRA) 180 MG tablet Take 180 mg by mouth daily.    . fish oil-omega-3 fatty acids 1000 MG capsule Take 1 g by mouth 2 (two) times  daily.      . fluticasone (FLONASE) 50 MCG/ACT nasal spray Place 2 sprays into both nostrils daily.    Marland Kitchen GLUCOSAMINE-CHONDROITIN-VIT C PO Take 2,000 mg by mouth daily.      Marland Kitchen losartan (COZAAR) 100 MG tablet TAKE 1/2 (ONE-HALF) TABLET BY MOUTH  ONCE DAILY 45 tablet 5  . Multiple Vitamin (MULTIVITAMIN) tablet Take 1 tablet by mouth daily.      . naproxen sodium (ALEVE) 220 MG tablet Take 220 mg by mouth as needed.      . pantoprazole (PROTONIX) 40 MG tablet TAKE 1 TABLET BY MOUTH DAILY 90 tablet 3   No current facility-administered medications on file prior to visit.    Allergies  Allergen Reactions  . Prednisone     REACTION: ? intolerance per patient / Cramps   Social History   Socioeconomic History  . Marital status: Married    Spouse name: Not on file  . Number of children: 2  . Years of education: Not on file  . Highest education level: Not on file  Occupational History  . Occupation: Manufacturing systems engineer  . Financial resource strain: Not on file  . Food insecurity    Worry: Not on file    Inability: Not on file  . Transportation needs    Medical: Not on file    Non-medical: Not on file  Tobacco Use  . Smoking status: Former Smoker    Quit date: 01/23/1967    Years since quitting: 51.8  . Smokeless tobacco: Never Used  Substance and Sexual Activity  . Alcohol use: No  . Drug use: No  . Sexual activity: Not on file  Lifestyle  . Physical activity    Days per week: Not on file    Minutes per session: Not on file  . Stress: Not on file  Relationships  . Social Herbalist on phone: Not on file    Gets together: Not on file    Attends religious service: Not on file    Active member of club or organization: Not on file    Attends meetings of clubs or organizations: Not on file    Relationship status: Not on file  . Intimate partner violence    Fear of current or ex partner: Not on file    Emotionally abused: Not on file    Physically abused: Not on file     Forced sexual activity: Not on file  Other Topics Concern  . Not on file  Social History Narrative   Lives in [place]   Family History  Problem Relation Age of Onset  . Coronary artery disease Brother        x2. One had CABG at 30. other with CABG in his 69's. Father with MI in his 61's  . Esophageal cancer Father   . Breast cancer Mother   . Colon cancer Neg Hx   . Rectal cancer Neg Hx   . Stomach cancer Neg Hx   . Colon polyps Neg Hx      Risk Factors  Current exercise habits:  Dietary issues discussed:   Cardiac risk factors: Obesity (BMI >= 30 kg/m2).   Depression Screen  (Note: if answer to either of the following is "Yes", a more complete depression screening is indicated)  Over the past two weeks, have you felt down, depressed or hopeless? No Over the past two weeks, have you felt little interest or pleasure in doing things? No Have you lost interest or pleasure in daily life? No Do you often feel hopeless? No Do you cry easily over simple problems? No   Activities of Daily Living  In your present state of health, do you have any difficulty performing the following activities?:  Driving? No  Managing money?  No  Feeding yourself? No  Getting from bed to chair? No  Climbing a flight of stairs? No  Preparing food and eating?: No  Bathing or showering? No  Getting dressed: No  Getting to the toilet? No  Using the toilet:No  Moving around from place to place: No  In the past year have you fallen or had a near fall?:No  Are you sexually active? Yes Do you have more than one partner? No   Hearing Difficulties: No  Do you often ask people to speak up or repeat themselves? No  Do you experience ringing or noises in your ears? No Do you have difficulty understanding soft or whispered voices? No  Do you feel that you have a problem with memory? No Do you often misplace items? No  Do you feel safe at home? Yes  Cognitive Testing  Alert? Yes Normal  Appearance?Yes  Oriented to person? Yes Place? Yes  Time? Yes  Recall of three objects? Yes  Can perform simple calculations? Yes  Displays appropriate judgment?Yes  Can read the correct time from a watch face?Yes   Review of Systems  All other systems reviewed and are negative.      Objective:   Physical Exam  Constitutional: He is oriented to person, place, and time. He appears well-developed and well-nourished. No distress.  HENT:  Head: Normocephalic and atraumatic.  Right Ear: External ear normal.  Left Ear: External ear normal.  Nose: Nose normal.  Mouth/Throat: Oropharynx is clear and moist. No oropharyngeal exudate.  Eyes: Pupils are equal, round, and reactive to light. Conjunctivae and EOM are normal. Right eye exhibits no discharge. Left eye exhibits no discharge. No scleral icterus.  Neck: Normal range of motion. Neck supple. No JVD present. No tracheal deviation present. No thyromegaly present.  Cardiovascular: Normal rate, regular rhythm, normal heart sounds and intact distal pulses. Exam reveals no gallop and no friction rub.  No murmur heard. Pulmonary/Chest: Effort normal and breath sounds normal. No stridor. No respiratory distress. He has no wheezes. He has no rales. He exhibits no tenderness.  Abdominal: Soft. Bowel sounds are normal. He exhibits no distension and no mass. There is no abdominal tenderness. There is no rebound and no guarding.  Genitourinary:    Rectum normal.  Prostate is not enlarged and not tender.  Musculoskeletal: Normal range of motion.        General: No tenderness or edema.  Lymphadenopathy:    He has no cervical adenopathy.  Neurological: He is alert and oriented to person, place, and time. He has normal reflexes. No cranial nerve deficit. He exhibits normal muscle tone. Coordination normal.  Skin: Skin is warm. No rash noted. He is not diaphoretic. No erythema. No pallor.  Psychiatric: He has a normal mood and affect. His behavior is  normal. Judgment and thought content normal.  Vitals reviewed.         Assessment & Plan:  Routine general medical examination at a health care facility  Benign essential HTN  Pure hypercholesterolemia  Prostate cancer screening  Personal history of colonic adenomas  Patient's physical exam today is completely normal.  His immunizations are up-to-date except for the Shingrix vaccine and tetanus shot which I recommended.  Blood pressure slightly elevated however the patient will check his blood pressure frequently at home and notify me of the values over the next 2 weeks.  Lab work is outstanding.  Colonoscopy is up-to-date.  Prostate cancer screening is up-to-date.  He denies any issues  with falls, memory loss, or depression.  Routine anticipatory guidance is provided.

## 2018-11-27 DIAGNOSIS — R350 Frequency of micturition: Secondary | ICD-10-CM | POA: Diagnosis not present

## 2018-11-28 ENCOUNTER — Other Ambulatory Visit: Payer: Self-pay | Admitting: Family Medicine

## 2019-01-05 ENCOUNTER — Other Ambulatory Visit: Payer: Self-pay | Admitting: Family Medicine

## 2019-01-08 DIAGNOSIS — R350 Frequency of micturition: Secondary | ICD-10-CM | POA: Diagnosis not present

## 2019-02-12 DIAGNOSIS — R350 Frequency of micturition: Secondary | ICD-10-CM | POA: Diagnosis not present

## 2019-03-13 ENCOUNTER — Other Ambulatory Visit: Payer: Self-pay | Admitting: Family Medicine

## 2019-03-19 DIAGNOSIS — R350 Frequency of micturition: Secondary | ICD-10-CM | POA: Diagnosis not present

## 2019-03-26 DIAGNOSIS — Z85828 Personal history of other malignant neoplasm of skin: Secondary | ICD-10-CM | POA: Diagnosis not present

## 2019-03-26 DIAGNOSIS — L218 Other seborrheic dermatitis: Secondary | ICD-10-CM | POA: Diagnosis not present

## 2019-03-26 DIAGNOSIS — C44319 Basal cell carcinoma of skin of other parts of face: Secondary | ICD-10-CM | POA: Diagnosis not present

## 2019-03-26 DIAGNOSIS — D2262 Melanocytic nevi of left upper limb, including shoulder: Secondary | ICD-10-CM | POA: Diagnosis not present

## 2019-03-26 DIAGNOSIS — L821 Other seborrheic keratosis: Secondary | ICD-10-CM | POA: Diagnosis not present

## 2019-03-26 DIAGNOSIS — D225 Melanocytic nevi of trunk: Secondary | ICD-10-CM | POA: Diagnosis not present

## 2019-03-26 DIAGNOSIS — L570 Actinic keratosis: Secondary | ICD-10-CM | POA: Diagnosis not present

## 2019-04-23 DIAGNOSIS — R350 Frequency of micturition: Secondary | ICD-10-CM | POA: Diagnosis not present

## 2019-05-28 DIAGNOSIS — R350 Frequency of micturition: Secondary | ICD-10-CM | POA: Diagnosis not present

## 2019-07-02 DIAGNOSIS — R350 Frequency of micturition: Secondary | ICD-10-CM | POA: Diagnosis not present

## 2019-07-03 DIAGNOSIS — Z961 Presence of intraocular lens: Secondary | ICD-10-CM | POA: Diagnosis not present

## 2019-07-03 DIAGNOSIS — H524 Presbyopia: Secondary | ICD-10-CM | POA: Diagnosis not present

## 2019-07-03 DIAGNOSIS — H5201 Hypermetropia, right eye: Secondary | ICD-10-CM | POA: Diagnosis not present

## 2019-07-03 DIAGNOSIS — H52223 Regular astigmatism, bilateral: Secondary | ICD-10-CM | POA: Diagnosis not present

## 2019-08-06 DIAGNOSIS — R350 Frequency of micturition: Secondary | ICD-10-CM | POA: Diagnosis not present

## 2019-08-20 ENCOUNTER — Ambulatory Visit (INDEPENDENT_AMBULATORY_CARE_PROVIDER_SITE_OTHER): Payer: Medicare HMO | Admitting: Family Medicine

## 2019-08-20 ENCOUNTER — Other Ambulatory Visit: Payer: Self-pay

## 2019-08-20 ENCOUNTER — Ambulatory Visit
Admission: RE | Admit: 2019-08-20 | Discharge: 2019-08-20 | Disposition: A | Discharge status: Home / Self Care | Payer: Medicare HMO | Source: Ambulatory Visit | Attending: Family Medicine | Admitting: Family Medicine

## 2019-08-20 VITALS — BP 142/80 | HR 74 | Temp 97.0°F | Ht 73.0 in | Wt 228.0 lb

## 2019-08-20 DIAGNOSIS — R0781 Pleurodynia: Secondary | ICD-10-CM

## 2019-08-20 DIAGNOSIS — I700 Atherosclerosis of aorta: Secondary | ICD-10-CM | POA: Diagnosis not present

## 2019-08-20 MED ORDER — HYDROCODONE-ACETAMINOPHEN 5-325 MG PO TABS: 1.0000 | ORAL_TABLET | Freq: Four times a day (QID) | ORAL | 0 refills | Status: DC | PRN

## 2019-08-20 MED ORDER — AZITHROMYCIN 250 MG PO TABS: ORAL_TABLET | ORAL | 0 refills | Status: DC

## 2019-08-20 NOTE — Progress Notes (Signed)
Subjective:    Patient ID: Michael Nguyen, male    DOB: [birth year], 79 y.o.   MRN: [id number]  HPI Patient has been battling chest congestion for a couple of weeks.  He states that he has been having a dry nonproductive cough for 2 weeks.  The cough is gradually worsening and now he is developed audible congestion when he coughs although the cough is still nonproductive.  He denies any fevers or chills.  However Monday, he slipped and fell on concrete while working on his lawnmower.  He landed on his left side.  He has swelling in his left hand today although no pain and no tenderness to palpation.  He has full range of motion in his left wrist, MCP joints, PIP joints, and DIP joints without pain.  However he has significant pain over his lower left ribs in the midaxillary line.  He jumps when I palpate along the border of the 10th rib.  There is no visible bruising on the skin.  There is no palpable crepitus.  However he does have diminished breath sounds in that area with decreased air movement suggesting possible pleural effusion. Past Medical History:  Diagnosis Date  . Allergy   . Arthritis    "some" per pt  . BPH (benign prostatic hypertrophy)   . Cancer ([place])    skin- basal and squamous cell CA  . Cataract   . GERD (gastroesophageal reflux disease)   . History of ETT    with dr. Acie Fredrickson was normal per patient  . Hyperlipidemia   . Hypertension    Past Surgical History:  Procedure Laterality Date  . cataracts     both eyes  . COLONOSCOPY    . TONSILLECTOMY     Current Outpatient Medications on File Prior to Visit  Medication Sig Dispense Refill  . ALOE VERA PO Take by mouth daily.    Marland Kitchen aspirin 81 MG tablet Take 81 mg by mouth daily.      Marland Kitchen atorvastatin (LIPITOR) 40 MG tablet Take 1 tablet by mouth once daily 90 tablet 2  . Cholecalciferol (VITAMIN D3) 1000 UNITS CAPS Take 1 capsule by mouth 2 (two) times daily.      . fexofenadine (ALLEGRA) 180 MG tablet Take 180 mg by mouth  daily.    . fish oil-omega-3 fatty acids 1000 MG capsule Take 1 g by mouth 2 (two) times daily.      . fluticasone (FLONASE) 50 MCG/ACT nasal spray Place 2 sprays into both nostrils daily.    Marland Kitchen GLUCOSAMINE-CHONDROITIN-VIT C PO Take 2,000 mg by mouth daily.      Marland Kitchen losartan (COZAAR) 100 MG tablet Take 1/2 (one-half) tablet by mouth once daily 45 tablet 2  . Multiple Vitamin (MULTIVITAMIN) tablet Take 1 tablet by mouth daily.      . naproxen sodium (ALEVE) 220 MG tablet Take 220 mg by mouth as needed.      . pantoprazole (PROTONIX) 40 MG tablet Take 1 tablet by mouth daily 90 tablet 3   No current facility-administered medications on file prior to visit.   Allergies  Allergen Reactions  . Prednisone     REACTION: ? intolerance per patient / Cramps   Social History   Socioeconomic History  . Marital status: Married    Spouse name: Not on file  . Number of children: 2  . Years of education: Not on file  . Highest education level: Not on file  Occupational History  . Occupation: Dealer  Tobacco Use  . Smoking status: Former Smoker    Quit date: 01/23/1967    Years since quitting: 52.6  . Smokeless tobacco: Never Used  Vaping Use  . Vaping Use: Never used  Substance and Sexual Activity  . Alcohol use: No  . Drug use: No  . Sexual activity: Not on file  Other Topics Concern  . Not on file  Social History Narrative   Lives in [place] Strain:   . Difficulty of Paying Living Expenses:   Food Insecurity:   . Worried About Charity fundraiser in the Last Year:   . Arboriculturist in the Last Year:   Transportation Needs:   . Film/video editor (Medical):   Marland Kitchen Lack of Transportation (Non-Medical):   Physical Activity:   . Days of Exercise per Week:   . Minutes of Exercise per Session:   Stress:   . Feeling of Stress :   Social Connections:   . Frequency of Communication with Friends and Family:   . Frequency of  Social Gatherings with Friends and Family:   . Attends Religious Services:   . Active Member of Clubs or Organizations:   . Attends Archivist Meetings:   Marland Kitchen Marital Status:   Intimate Partner Violence:   . Fear of Current or Ex-Partner:   . Emotionally Abused:   Marland Kitchen Physically Abused:   . Sexually Abused:       Review of Systems  Cardiovascular: Negative for chest pain, palpitations and leg swelling.  Neurological: Positive for light-headedness. Negative for dizziness, tremors, seizures, syncope, facial asymmetry, speech difficulty, weakness, numbness and headaches.  All other systems reviewed and are negative.      Objective:   Physical Exam Constitutional:      General: He is not in acute distress.    Appearance: He is well-developed. He is not diaphoretic.  HENT:     Head: Normocephalic and atraumatic.     Right Ear: External ear normal.     Left Ear: External ear normal.     Mouth/Throat:     Pharynx: No oropharyngeal exudate.  Eyes:     Conjunctiva/sclera: Conjunctivae normal.  Neck:     Thyroid: No thyromegaly.     Vascular: No JVD.  Cardiovascular:     Rate and Rhythm: Normal rate and regular rhythm.     Pulses:          Carotid pulses are 2+ on the right side and 2+ on the left side.    Heart sounds: Normal heart sounds. No murmur heard.  No friction rub. No gallop.   Pulmonary:     Effort: Pulmonary effort is normal. No respiratory distress.     Breath sounds: Examination of the left-lower field reveals decreased breath sounds. Decreased breath sounds present. No wheezing or rales.    Chest:     Chest wall: Tenderness present. No crepitus.    Abdominal:     General: Bowel sounds are normal. There is no distension.     Palpations: Abdomen is soft.     Tenderness: There is no abdominal tenderness. There is no guarding or rebound.  Musculoskeletal:     Cervical back: Neck supple.  Lymphadenopathy:     Cervical: No cervical adenopathy.    Neurological:     Mental Status: He is alert and oriented to person, place, and time.     Cranial Nerves: No cranial  nerve deficit.     Motor: No abnormal muscle tone.     Coordination: Coordination normal.     Deep Tendon Reflexes: Reflexes are normal and symmetric.        Assessment & Plan:  Rib pain on left side - Plan: DG Chest 2 View  I believe the patient is likely battling bronchitis.  However I believe the fall on Monday likely cracked a rib on the lower left side.  I believe he has a sympathetic pleural effusion developing due to that.  I recommended a chest x-ray today to evaluate further.  Meanwhile treat the bronchitis with a Z-Pak.  Due to the pain that he is experiencing in the ribs in that area, I will give the patient Norco 5/325 1 p.o. every 6 hours as needed pain.  Await the results of the x-ray

## 2019-08-24 ENCOUNTER — Other Ambulatory Visit: Payer: Self-pay | Admitting: Family Medicine

## 2019-09-03 DIAGNOSIS — R69 Illness, unspecified: Secondary | ICD-10-CM | POA: Diagnosis not present

## 2019-09-10 DIAGNOSIS — R350 Frequency of micturition: Secondary | ICD-10-CM | POA: Diagnosis not present

## 2019-10-14 ENCOUNTER — Other Ambulatory Visit: Payer: Self-pay | Admitting: Family Medicine

## 2019-10-15 DIAGNOSIS — R350 Frequency of micturition: Secondary | ICD-10-CM | POA: Diagnosis not present

## 2019-11-05 ENCOUNTER — Ambulatory Visit (INDEPENDENT_AMBULATORY_CARE_PROVIDER_SITE_OTHER): Payer: Medicare HMO | Admitting: Family Medicine

## 2019-11-05 ENCOUNTER — Other Ambulatory Visit: Payer: Self-pay

## 2019-11-05 DIAGNOSIS — Z23 Encounter for immunization: Secondary | ICD-10-CM

## 2019-11-19 DIAGNOSIS — R350 Frequency of micturition: Secondary | ICD-10-CM | POA: Diagnosis not present

## 2019-12-24 DIAGNOSIS — R350 Frequency of micturition: Secondary | ICD-10-CM | POA: Diagnosis not present

## 2020-01-04 ENCOUNTER — Other Ambulatory Visit: Payer: Self-pay

## 2020-01-04 ENCOUNTER — Other Ambulatory Visit: Payer: Medicare HMO

## 2020-01-04 DIAGNOSIS — E7800 Pure hypercholesterolemia, unspecified: Secondary | ICD-10-CM

## 2020-01-04 DIAGNOSIS — Z125 Encounter for screening for malignant neoplasm of prostate: Secondary | ICD-10-CM | POA: Diagnosis not present

## 2020-01-05 LAB — CBC WITH DIFFERENTIAL/PLATELET
Absolute Monocytes: 511 cells/uL (ref 200–950)
Basophils Absolute: 42 cells/uL (ref 0–200)
Basophils Relative: 0.6 %
Eosinophils Absolute: 252 cells/uL (ref 15–500)
Eosinophils Relative: 3.6 %
HCT: 46.8 % (ref 38.5–50.0)
Hemoglobin: 15.9 g/dL (ref 13.2–17.1)
Lymphs Abs: 2184 cells/uL (ref 850–3900)
MCH: 31.2 pg (ref 27.0–33.0)
MCHC: 34 g/dL (ref 32.0–36.0)
MCV: 91.9 fL (ref 80.0–100.0)
MPV: 12 fL (ref 7.5–12.5)
Monocytes Relative: 7.3 %
Neutro Abs: 4011 cells/uL (ref 1500–7800)
Neutrophils Relative %: 57.3 %
Platelets: 200 10*3/uL (ref 140–400)
RBC: 5.09 10*6/uL (ref 4.20–5.80)
RDW: 12.1 % (ref 11.0–15.0)
Total Lymphocyte: 31.2 %
WBC: 7 10*3/uL (ref 3.8–10.8)

## 2020-01-05 LAB — COMPLETE METABOLIC PANEL WITH GFR
AG Ratio: 1.8 (calc) (ref 1.0–2.5)
ALT: 26 U/L (ref 9–46)
AST: 26 U/L (ref 10–35)
Albumin: 4.3 g/dL (ref 3.6–5.1)
Alkaline phosphatase (APISO): 55 U/L (ref 35–144)
BUN/Creatinine Ratio: 13 (calc) (ref 6–22)
BUN: 15 mg/dL (ref 7–25)
CO2: 27 mmol/L (ref 20–32)
Calcium: 9.8 mg/dL (ref 8.6–10.3)
Chloride: 106 mmol/L (ref 98–110)
Creat: 1.19 mg/dL — ABNORMAL HIGH (ref 0.70–1.18)
GFR, Est African American: 67 mL/min/{1.73_m2} (ref >=60)
GFR, Est Non African American: 58 mL/min/{1.73_m2} — ABNORMAL LOW (ref >=60)
Globulin: 2.4 g/dL (calc) (ref 1.9–3.7)
Glucose, Bld: 94 mg/dL (ref 65–99)
Potassium: 4.4 mmol/L (ref 3.5–5.3)
Sodium: 142 mmol/L (ref 135–146)
Total Bilirubin: 1 mg/dL (ref 0.2–1.2)
Total Protein: 6.7 g/dL (ref 6.1–8.1)

## 2020-01-05 LAB — LIPID PANEL
Cholesterol: 123 mg/dL (ref <200)
HDL: 42 mg/dL (ref >=40)
LDL Cholesterol (Calc): 60 mg/dL (calc)
Non-HDL Cholesterol (Calc): 81 mg/dL (calc) (ref <130)
Total CHOL/HDL Ratio: 2.9 (calc) (ref <5.0)
Triglycerides: 119 mg/dL (ref <150)

## 2020-01-05 LAB — PSA: PSA: 1.33 ng/mL (ref <=4.0)

## 2020-01-07 ENCOUNTER — Other Ambulatory Visit: Payer: Self-pay

## 2020-01-07 ENCOUNTER — Ambulatory Visit (INDEPENDENT_AMBULATORY_CARE_PROVIDER_SITE_OTHER): Payer: Medicare HMO | Admitting: Family Medicine

## 2020-01-07 VITALS — BP 140/82 | HR 76 | Temp 98.1°F | Ht 73.0 in | Wt 217.0 lb

## 2020-01-07 DIAGNOSIS — Z125 Encounter for screening for malignant neoplasm of prostate: Secondary | ICD-10-CM

## 2020-01-07 DIAGNOSIS — Z86010 Personal history of colonic polyps: Secondary | ICD-10-CM

## 2020-01-07 DIAGNOSIS — Z0001 Encounter for general adult medical examination with abnormal findings: Secondary | ICD-10-CM | POA: Diagnosis not present

## 2020-01-07 DIAGNOSIS — E7800 Pure hypercholesterolemia, unspecified: Secondary | ICD-10-CM

## 2020-01-07 DIAGNOSIS — I10 Essential (primary) hypertension: Secondary | ICD-10-CM | POA: Diagnosis not present

## 2020-01-07 DIAGNOSIS — Z0000 Encounter for general adult medical examination without abnormal findings: Secondary | ICD-10-CM

## 2020-01-07 MED ORDER — LOSARTAN POTASSIUM 100 MG PO TABS: 50.0000 mg | ORAL_TABLET | Freq: Every day | ORAL | 3 refills | Status: DC

## 2020-01-07 NOTE — Progress Notes (Signed)
Subjective:    Patient ID: Michael Nguyen, male    DOB: 01-Mar-1940, 79 y.o.   MRN: [id number]  HPI  Patient is here today for complete physical exam.  He denies any concerns.  His immunizations are up-to-date except for the Shingrix vaccine as well as tetanus shot.  He declines both of these today.  His last colonoscopy was performed in 2019 and he did have several polyps.  Repeat colonoscopy would be recommended in 2022.  However he is doing well now with no concerns.  He had his PSA checked on his most recent lab work which was 1.33.  This shows no change from last year suggesting against any prostate cancer.  He denies any issues with falls, depression, or memory loss.  Most recent lab work is listed below and is excellent.  Lab on 01/04/2020  Component Date Value Ref Range Status  . Glucose, Bld 01/04/2020 94  65 - 99 mg/dL Final   Comment: .            Fasting reference interval .   . BUN 01/04/2020 15  7 - 25 mg/dL Final  . Creat 01/04/2020 1.19* 0.70 - 1.18 mg/dL Final   Comment: For patients >71 years of age, the reference limit for Creatinine is approximately 13% higher for people identified as African-American. .   . GFR, Est Non African American 01/04/2020 58* > OR = 60 mL/min/1.13m2 Final  . GFR, Est African American 01/04/2020 67  > OR = 60 mL/min/1.71m2 Final  . BUN/Creatinine Ratio 01/04/2020 13  6 - 22 (calc) Final  . Sodium 01/04/2020 142  135 - 146 mmol/L Final  . Potassium 01/04/2020 4.4  3.5 - 5.3 mmol/L Final  . Chloride 01/04/2020 106  98 - 110 mmol/L Final  . CO2 01/04/2020 27  20 - 32 mmol/L Final  . Calcium 01/04/2020 9.8  8.6 - 10.3 mg/dL Final  . Total Protein 01/04/2020 6.7  6.1 - 8.1 g/dL Final  . Albumin 01/04/2020 4.3  3.6 - 5.1 g/dL Final  . Globulin 01/04/2020 2.4  1.9 - 3.7 g/dL (calc) Final  . AG Ratio 01/04/2020 1.8  1.0 - 2.5 (calc) Final  . Total Bilirubin 01/04/2020 1.0  0.2 - 1.2 mg/dL Final  . Alkaline phosphatase (APISO) 01/04/2020 55  35 -  144 U/L Final  . AST 01/04/2020 26  10 - 35 U/L Final  . ALT 01/04/2020 26  9 - 46 U/L Final  . WBC 01/04/2020 7.0  3.8 - 10.8 Thousand/uL Final  . RBC 01/04/2020 5.09  4.20 - 5.80 Million/uL Final  . Hemoglobin 01/04/2020 15.9  13.2 - 17.1 g/dL Final  . HCT 01/04/2020 46.8  38.5 - 50.0 % Final  . MCV 01/04/2020 91.9  80.0 - 100.0 fL Final  . MCH 01/04/2020 31.2  27.0 - 33.0 pg Final  . MCHC 01/04/2020 34.0  32.0 - 36.0 g/dL Final  . RDW 01/04/2020 12.1  11.0 - 15.0 % Final  . Platelets 01/04/2020 200  140 - 400 Thousand/uL Final  . MPV 01/04/2020 12.0  7.5 - 12.5 fL Final  . Neutro Abs 01/04/2020 4,011  1,500 - 7,800 cells/uL Final  . Lymphs Abs 01/04/2020 2,184  850 - 3,900 cells/uL Final  . Absolute Monocytes 01/04/2020 511  200 - 950 cells/uL Final  . Eosinophils Absolute 01/04/2020 252  15 - 500 cells/uL Final  . Basophils Absolute 01/04/2020 42  0 - 200 cells/uL Final  . Neutrophils Relative % 01/04/2020 57.3  %  Final  . Total Lymphocyte 01/04/2020 31.2  % Final  . Monocytes Relative 01/04/2020 7.3  % Final  . Eosinophils Relative 01/04/2020 3.6  % Final  . Basophils Relative 01/04/2020 0.6  % Final  . Cholesterol 01/04/2020 123  <200 mg/dL Final  . HDL 01/04/2020 42  > OR = 40 mg/dL Final  . Triglycerides 01/04/2020 119  <150 mg/dL Final  . LDL Cholesterol (Calc) 01/04/2020 60  mg/dL (calc) Final   Comment: Reference range: <100 . Desirable range <100 mg/dL for primary prevention;   <70 mg/dL for patients with CHD or diabetic patients  with > or = 2 CHD risk factors. Marland Kitchen LDL-C is now calculated using the Martin-Hopkins  calculation, which is a validated novel method providing  better accuracy than the Friedewald equation in the  estimation of LDL-C.  Cresenciano Genre et al. Annamaria Helling. 6294;765(46): 2061-2068  (http://education.[facility].com/faq/FAQ164)   . Total CHOL/HDL Ratio 01/04/2020 2.9  <5.0 (calc) Final  . Non-HDL Cholesterol (Calc) 01/04/2020 81  <130 mg/dL (calc)  Final   Comment: For patients with diabetes plus 1 major ASCVD risk  factor, treating to a non-HDL-C goal of <100 mg/dL  (LDL-C of <70 mg/dL) is considered a therapeutic  option.   Marland Kitchen PSA 01/04/2020 1.33  < OR = 4.0 ng/mL Final   Comment: The total PSA value from this assay system is  standardized against the WHO standard. The test  result will be approximately 20% lower when compared  to the equimolar-standardized total PSA (Beckman  Coulter). Comparison of serial PSA results should be  interpreted with this fact in mind. . This test was performed using the Siemens  chemiluminescent method. Values obtained from  different assay methods cannot be used interchangeably. PSA levels, regardless of value, should not be interpreted as absolute evidence of the presence or absence of disease.     Subjective:    Review Past Medical/Family/Social: Past Medical History:  Diagnosis Date  . Allergy   . Arthritis    "some" per pt  . BPH (benign prostatic hypertrophy)   . Cancer ([place])    skin- basal and squamous cell CA  . Cataract   . GERD (gastroesophageal reflux disease)   . History of ETT    with dr. Acie Fredrickson was normal per patient  . Hyperlipidemia   . Hypertension    Past Surgical History:  Procedure Laterality Date  . cataracts     both eyes  . COLONOSCOPY    . TONSILLECTOMY     Current Outpatient Medications on File Prior to Visit  Medication Sig Dispense Refill  . ALOE VERA PO Take by mouth daily.    Marland Kitchen aspirin 81 MG tablet Take 81 mg by mouth daily.    Marland Kitchen atorvastatin (LIPITOR) 40 MG tablet Take 1 tablet by mouth once daily 90 tablet 2  . Cholecalciferol (VITAMIN D3) 1000 UNITS CAPS Take 1 capsule by mouth 2 (two) times daily.    . fexofenadine (ALLEGRA) 180 MG tablet Take 180 mg by mouth daily.    . fish oil-omega-3 fatty acids 1000 MG capsule Take 1 g by mouth 2 (two) times daily.    . fluticasone (FLONASE) 50 MCG/ACT nasal spray Place 2 sprays into both nostrils  daily.    Marland Kitchen GLUCOSAMINE-CHONDROITIN-VIT C PO Take 2,000 mg by mouth daily.    Marland Kitchen HYDROcodone-acetaminophen (NORCO) 5-325 MG tablet Take 1 tablet by mouth every 6 (six) hours as needed for moderate pain. 30 tablet 0  . Multiple Vitamin (MULTIVITAMIN) tablet  Take 1 tablet by mouth daily.    . naproxen sodium (ALEVE) 220 MG tablet Take 220 mg by mouth as needed.    . pantoprazole (PROTONIX) 40 MG tablet Take 1 tablet by mouth daily 90 tablet 3   No current facility-administered medications on file prior to visit.   Allergies  Allergen Reactions  . Prednisone     REACTION: ? intolerance per patient / Cramps   Social History   Socioeconomic History  . Marital status: Married    Spouse name: Not on file  . Number of children: 2  . Years of education: Not on file  . Highest education level: Not on file  Occupational History  . Occupation: Dealer  Tobacco Use  . Smoking status: Former Smoker    Quit date: 01/23/1967    Years since quitting: 52.9  . Smokeless tobacco: Never Used  Vaping Use  . Vaping Use: Never used  Substance and Sexual Activity  . Alcohol use: No  . Drug use: No  . Sexual activity: Not on file  Other Topics Concern  . Not on file  Social History Narrative   Lives in [place] Determinants of Health   Financial Resource Strain: Not on file  Food Insecurity: Not on file  Transportation Needs: Not on file  Physical Activity: Not on file  Stress: Not on file  Social Connections: Not on file  Intimate Partner Violence: Not on file   Family History  Problem Relation Age of Onset  . Coronary artery disease Brother        x2. One had CABG at 74. other with CABG in his 81's. Father with MI in his 79's  . Esophageal cancer Father   . Breast cancer Mother   . Colon cancer Neg Hx   . Rectal cancer Neg Hx   . Stomach cancer Neg Hx   . Colon polyps Neg Hx       Review of Systems  All other systems reviewed and are negative.      Objective:    Physical Exam Vitals reviewed.  Constitutional:      General: He is not in acute distress.    Appearance: He is well-developed. He is not diaphoretic.  HENT:     Head: Normocephalic and atraumatic.     Right Ear: External ear normal.     Left Ear: External ear normal.     Nose: Nose normal.     Mouth/Throat:     Pharynx: No oropharyngeal exudate.  Eyes:     General: No scleral icterus.       Right eye: No discharge.        Left eye: No discharge.     Conjunctiva/sclera: Conjunctivae normal.     Pupils: Pupils are equal, round, and reactive to light.  Neck:     Thyroid: No thyromegaly.     Vascular: No JVD.     Trachea: No tracheal deviation.  Cardiovascular:     Rate and Rhythm: Normal rate and regular rhythm.     Heart sounds: Normal heart sounds. No murmur heard. No friction rub. No gallop.   Pulmonary:     Effort: Pulmonary effort is normal. No respiratory distress.     Breath sounds: Normal breath sounds. No stridor. No wheezing or rales.  Chest:     Chest wall: No tenderness.  Abdominal:     General: Bowel sounds are normal. There is no distension.     Palpations: Abdomen  is soft. There is no mass.     Tenderness: There is no abdominal tenderness. There is no guarding or rebound.  Genitourinary:    Prostate: Not enlarged and not tender.     Rectum: Normal.  Musculoskeletal:        General: No tenderness. Normal range of motion.     Cervical back: Normal range of motion and neck supple.  Lymphadenopathy:     Cervical: No cervical adenopathy.  Skin:    General: Skin is warm.     Coloration: Skin is not pale.     Findings: No erythema or rash.  Neurological:     Mental Status: He is alert and oriented to person, place, and time.     Cranial Nerves: No cranial nerve deficit.     Motor: No abnormal muscle tone.     Coordination: Coordination normal.     Deep Tendon Reflexes: Reflexes are normal and symmetric.  Psychiatric:        Behavior: Behavior normal.         Thought Content: Thought content normal.        Judgment: Judgment normal.           Assessment & Plan:  Routine general medical examination at a health care facility  Benign essential HTN  Pure hypercholesterolemia  Personal history of colonic adenomas  Prostate cancer screening  Physical exam today is excellent.  Recommended the shingles vaccine.  Otherwise his preventative care is up-to-date.  Colonoscopy is due next year.  Prostate cancer screening test/PSA is essentially unchanged over the last 4 years.  He has had his Covid vaccine and the booster.  He denies any falls, depression, or memory loss.  Labs do show some mild age-related decline in his renal function.  I encouraged the patient not to overdo NSAID therapy and also to drink plenty of water every day.

## 2020-01-12 ENCOUNTER — Other Ambulatory Visit: Payer: Self-pay

## 2020-01-12 MED ORDER — LOSARTAN POTASSIUM 100 MG PO TABS: 50.0000 mg | ORAL_TABLET | Freq: Every day | ORAL | 3 refills | Status: DC

## 2020-01-28 DIAGNOSIS — R350 Frequency of micturition: Secondary | ICD-10-CM | POA: Diagnosis not present

## 2020-03-03 DIAGNOSIS — R350 Frequency of micturition: Secondary | ICD-10-CM | POA: Diagnosis not present

## 2020-03-10 ENCOUNTER — Other Ambulatory Visit: Payer: Self-pay | Admitting: Family Medicine

## 2020-03-28 DIAGNOSIS — L821 Other seborrheic keratosis: Secondary | ICD-10-CM | POA: Diagnosis not present

## 2020-03-28 DIAGNOSIS — L218 Other seborrheic dermatitis: Secondary | ICD-10-CM | POA: Diagnosis not present

## 2020-03-28 DIAGNOSIS — L814 Other melanin hyperpigmentation: Secondary | ICD-10-CM | POA: Diagnosis not present

## 2020-03-28 DIAGNOSIS — Z85828 Personal history of other malignant neoplasm of skin: Secondary | ICD-10-CM | POA: Diagnosis not present

## 2020-03-28 DIAGNOSIS — D485 Neoplasm of uncertain behavior of skin: Secondary | ICD-10-CM | POA: Diagnosis not present

## 2020-03-28 DIAGNOSIS — D1801 Hemangioma of skin and subcutaneous tissue: Secondary | ICD-10-CM | POA: Diagnosis not present

## 2020-03-28 DIAGNOSIS — L570 Actinic keratosis: Secondary | ICD-10-CM | POA: Diagnosis not present

## 2020-04-14 DIAGNOSIS — R350 Frequency of micturition: Secondary | ICD-10-CM | POA: Diagnosis not present

## 2020-05-10 DIAGNOSIS — M9902 Segmental and somatic dysfunction of thoracic region: Secondary | ICD-10-CM | POA: Diagnosis not present

## 2020-05-10 DIAGNOSIS — M9904 Segmental and somatic dysfunction of sacral region: Secondary | ICD-10-CM | POA: Diagnosis not present

## 2020-05-10 DIAGNOSIS — M9903 Segmental and somatic dysfunction of lumbar region: Secondary | ICD-10-CM | POA: Diagnosis not present

## 2020-05-11 DIAGNOSIS — M9903 Segmental and somatic dysfunction of lumbar region: Secondary | ICD-10-CM | POA: Diagnosis not present

## 2020-05-11 DIAGNOSIS — M9902 Segmental and somatic dysfunction of thoracic region: Secondary | ICD-10-CM | POA: Diagnosis not present

## 2020-05-11 DIAGNOSIS — M9904 Segmental and somatic dysfunction of sacral region: Secondary | ICD-10-CM | POA: Diagnosis not present

## 2020-05-12 DIAGNOSIS — M9902 Segmental and somatic dysfunction of thoracic region: Secondary | ICD-10-CM | POA: Diagnosis not present

## 2020-05-12 DIAGNOSIS — M9904 Segmental and somatic dysfunction of sacral region: Secondary | ICD-10-CM | POA: Diagnosis not present

## 2020-05-12 DIAGNOSIS — M9903 Segmental and somatic dysfunction of lumbar region: Secondary | ICD-10-CM | POA: Diagnosis not present

## 2020-05-16 ENCOUNTER — Other Ambulatory Visit: Payer: Self-pay | Admitting: Family Medicine

## 2020-05-16 DIAGNOSIS — M62830 Muscle spasm of back: Secondary | ICD-10-CM | POA: Diagnosis not present

## 2020-05-16 DIAGNOSIS — M25552 Pain in left hip: Secondary | ICD-10-CM | POA: Diagnosis not present

## 2020-05-16 DIAGNOSIS — M9904 Segmental and somatic dysfunction of sacral region: Secondary | ICD-10-CM | POA: Diagnosis not present

## 2020-05-16 DIAGNOSIS — M9902 Segmental and somatic dysfunction of thoracic region: Secondary | ICD-10-CM | POA: Diagnosis not present

## 2020-05-16 DIAGNOSIS — M9903 Segmental and somatic dysfunction of lumbar region: Secondary | ICD-10-CM | POA: Diagnosis not present

## 2020-05-18 DIAGNOSIS — M9903 Segmental and somatic dysfunction of lumbar region: Secondary | ICD-10-CM | POA: Diagnosis not present

## 2020-05-18 DIAGNOSIS — M9904 Segmental and somatic dysfunction of sacral region: Secondary | ICD-10-CM | POA: Diagnosis not present

## 2020-05-18 DIAGNOSIS — M25552 Pain in left hip: Secondary | ICD-10-CM | POA: Diagnosis not present

## 2020-05-18 DIAGNOSIS — M9902 Segmental and somatic dysfunction of thoracic region: Secondary | ICD-10-CM | POA: Diagnosis not present

## 2020-05-18 DIAGNOSIS — M62830 Muscle spasm of back: Secondary | ICD-10-CM | POA: Diagnosis not present

## 2020-05-19 DIAGNOSIS — M25552 Pain in left hip: Secondary | ICD-10-CM | POA: Diagnosis not present

## 2020-05-19 DIAGNOSIS — M9904 Segmental and somatic dysfunction of sacral region: Secondary | ICD-10-CM | POA: Diagnosis not present

## 2020-05-19 DIAGNOSIS — R350 Frequency of micturition: Secondary | ICD-10-CM | POA: Diagnosis not present

## 2020-05-19 DIAGNOSIS — M62830 Muscle spasm of back: Secondary | ICD-10-CM | POA: Diagnosis not present

## 2020-05-19 DIAGNOSIS — M9902 Segmental and somatic dysfunction of thoracic region: Secondary | ICD-10-CM | POA: Diagnosis not present

## 2020-05-19 DIAGNOSIS — M9903 Segmental and somatic dysfunction of lumbar region: Secondary | ICD-10-CM | POA: Diagnosis not present

## 2020-05-23 DIAGNOSIS — M9902 Segmental and somatic dysfunction of thoracic region: Secondary | ICD-10-CM | POA: Diagnosis not present

## 2020-05-23 DIAGNOSIS — M9904 Segmental and somatic dysfunction of sacral region: Secondary | ICD-10-CM | POA: Diagnosis not present

## 2020-05-23 DIAGNOSIS — M9903 Segmental and somatic dysfunction of lumbar region: Secondary | ICD-10-CM | POA: Diagnosis not present

## 2020-05-23 DIAGNOSIS — M62830 Muscle spasm of back: Secondary | ICD-10-CM | POA: Diagnosis not present

## 2020-05-23 DIAGNOSIS — M25552 Pain in left hip: Secondary | ICD-10-CM | POA: Diagnosis not present

## 2020-05-25 ENCOUNTER — Other Ambulatory Visit: Payer: Self-pay | Admitting: Family Medicine

## 2020-05-25 DIAGNOSIS — M62830 Muscle spasm of back: Secondary | ICD-10-CM | POA: Diagnosis not present

## 2020-05-25 DIAGNOSIS — M25552 Pain in left hip: Secondary | ICD-10-CM | POA: Diagnosis not present

## 2020-05-25 DIAGNOSIS — M9903 Segmental and somatic dysfunction of lumbar region: Secondary | ICD-10-CM | POA: Diagnosis not present

## 2020-05-25 DIAGNOSIS — M9904 Segmental and somatic dysfunction of sacral region: Secondary | ICD-10-CM | POA: Diagnosis not present

## 2020-05-25 DIAGNOSIS — M9902 Segmental and somatic dysfunction of thoracic region: Secondary | ICD-10-CM | POA: Diagnosis not present

## 2020-05-26 DIAGNOSIS — M62830 Muscle spasm of back: Secondary | ICD-10-CM | POA: Diagnosis not present

## 2020-05-26 DIAGNOSIS — M25552 Pain in left hip: Secondary | ICD-10-CM | POA: Diagnosis not present

## 2020-05-26 DIAGNOSIS — M9904 Segmental and somatic dysfunction of sacral region: Secondary | ICD-10-CM | POA: Diagnosis not present

## 2020-05-26 DIAGNOSIS — M9903 Segmental and somatic dysfunction of lumbar region: Secondary | ICD-10-CM | POA: Diagnosis not present

## 2020-05-26 DIAGNOSIS — M9902 Segmental and somatic dysfunction of thoracic region: Secondary | ICD-10-CM | POA: Diagnosis not present

## 2020-06-07 ENCOUNTER — Other Ambulatory Visit: Payer: Self-pay | Admitting: Family Medicine

## 2020-06-16 DIAGNOSIS — H18513 Endothelial corneal dystrophy, bilateral: Secondary | ICD-10-CM | POA: Diagnosis not present

## 2020-06-23 DIAGNOSIS — R350 Frequency of micturition: Secondary | ICD-10-CM | POA: Diagnosis not present

## 2020-07-01 DIAGNOSIS — H18513 Endothelial corneal dystrophy, bilateral: Secondary | ICD-10-CM | POA: Diagnosis not present

## 2020-07-05 DIAGNOSIS — R350 Frequency of micturition: Secondary | ICD-10-CM | POA: Diagnosis not present

## 2020-07-05 DIAGNOSIS — R351 Nocturia: Secondary | ICD-10-CM | POA: Diagnosis not present

## 2020-07-11 DIAGNOSIS — H18511 Endothelial corneal dystrophy, right eye: Secondary | ICD-10-CM | POA: Diagnosis not present

## 2020-07-18 DIAGNOSIS — H18513 Endothelial corneal dystrophy, bilateral: Secondary | ICD-10-CM | POA: Diagnosis not present

## 2020-07-26 DIAGNOSIS — H18511 Endothelial corneal dystrophy, right eye: Secondary | ICD-10-CM | POA: Diagnosis not present

## 2020-07-26 DIAGNOSIS — Z01818 Encounter for other preprocedural examination: Secondary | ICD-10-CM | POA: Diagnosis not present

## 2020-07-28 DIAGNOSIS — R350 Frequency of micturition: Secondary | ICD-10-CM | POA: Diagnosis not present

## 2020-08-10 DIAGNOSIS — H18511 Endothelial corneal dystrophy, right eye: Secondary | ICD-10-CM | POA: Diagnosis not present

## 2020-08-10 DIAGNOSIS — H5789 Other specified disorders of eye and adnexa: Secondary | ICD-10-CM | POA: Diagnosis not present

## 2020-09-01 DIAGNOSIS — R350 Frequency of micturition: Secondary | ICD-10-CM | POA: Diagnosis not present

## 2020-09-07 ENCOUNTER — Other Ambulatory Visit: Payer: Self-pay | Admitting: Family Medicine

## 2020-09-29 DIAGNOSIS — R350 Frequency of micturition: Secondary | ICD-10-CM | POA: Diagnosis not present

## 2020-10-18 ENCOUNTER — Ambulatory Visit (INDEPENDENT_AMBULATORY_CARE_PROVIDER_SITE_OTHER): Payer: Medicare HMO | Admitting: *Deleted

## 2020-10-18 ENCOUNTER — Other Ambulatory Visit: Payer: Self-pay

## 2020-10-18 DIAGNOSIS — Z23 Encounter for immunization: Secondary | ICD-10-CM

## 2020-11-03 DIAGNOSIS — R350 Frequency of micturition: Secondary | ICD-10-CM | POA: Diagnosis not present

## 2020-11-11 ENCOUNTER — Other Ambulatory Visit: Payer: Self-pay

## 2020-11-11 MED ORDER — ATORVASTATIN CALCIUM 40 MG PO TABS: 40.0000 mg | ORAL_TABLET | Freq: Every day | ORAL | 0 refills | Status: DC

## 2020-11-16 ENCOUNTER — Other Ambulatory Visit: Payer: Self-pay | Admitting: Family Medicine

## 2020-12-06 ENCOUNTER — Other Ambulatory Visit: Payer: Self-pay | Admitting: Family Medicine

## 2020-12-08 DIAGNOSIS — R350 Frequency of micturition: Secondary | ICD-10-CM | POA: Diagnosis not present

## 2021-01-03 ENCOUNTER — Other Ambulatory Visit: Payer: Medicare HMO

## 2021-01-03 ENCOUNTER — Other Ambulatory Visit: Payer: Self-pay

## 2021-01-03 DIAGNOSIS — Z0000 Encounter for general adult medical examination without abnormal findings: Secondary | ICD-10-CM

## 2021-01-03 DIAGNOSIS — I10 Essential (primary) hypertension: Secondary | ICD-10-CM | POA: Diagnosis not present

## 2021-01-03 DIAGNOSIS — Z125 Encounter for screening for malignant neoplasm of prostate: Secondary | ICD-10-CM

## 2021-01-03 DIAGNOSIS — E7800 Pure hypercholesterolemia, unspecified: Secondary | ICD-10-CM | POA: Diagnosis not present

## 2021-01-04 LAB — CBC WITH DIFFERENTIAL/PLATELET
Absolute Monocytes: 555 cells/uL (ref 200–950)
Basophils Absolute: 38 cells/uL (ref 0–200)
Basophils Relative: 0.5 %
Eosinophils Absolute: 182 cells/uL (ref 15–500)
Eosinophils Relative: 2.4 %
HCT: 48.3 % (ref 38.5–50.0)
Hemoglobin: 16 g/dL (ref 13.2–17.1)
Lymphs Abs: 2189 cells/uL (ref 850–3900)
MCH: 31.3 pg (ref 27.0–33.0)
MCHC: 33.1 g/dL (ref 32.0–36.0)
MCV: 94.5 fL (ref 80.0–100.0)
MPV: 11.6 fL (ref 7.5–12.5)
Monocytes Relative: 7.3 %
Neutro Abs: 4636 cells/uL (ref 1500–7800)
Neutrophils Relative %: 61 %
Platelets: 198 10*3/uL (ref 140–400)
RBC: 5.11 10*6/uL (ref 4.20–5.80)
RDW: 11.9 % (ref 11.0–15.0)
Total Lymphocyte: 28.8 %
WBC: 7.6 10*3/uL (ref 3.8–10.8)

## 2021-01-04 LAB — COMPREHENSIVE METABOLIC PANEL
AG Ratio: 1.8 (calc) (ref 1.0–2.5)
ALT: 15 U/L (ref 9–46)
AST: 17 U/L (ref 10–35)
Albumin: 4.2 g/dL (ref 3.6–5.1)
Alkaline phosphatase (APISO): 48 U/L (ref 35–144)
BUN: 18 mg/dL (ref 7–25)
CO2: 29 mmol/L (ref 20–32)
Calcium: 10 mg/dL (ref 8.6–10.3)
Chloride: 105 mmol/L (ref 98–110)
Creat: 1.14 mg/dL (ref 0.70–1.22)
Globulin: 2.4 g/dL (calc) (ref 1.9–3.7)
Glucose, Bld: 89 mg/dL (ref 65–99)
Potassium: 4.2 mmol/L (ref 3.5–5.3)
Sodium: 142 mmol/L (ref 135–146)
Total Bilirubin: 0.9 mg/dL (ref 0.2–1.2)
Total Protein: 6.6 g/dL (ref 6.1–8.1)

## 2021-01-04 LAB — LIPID PANEL
Cholesterol: 147 mg/dL (ref <200)
HDL: 44 mg/dL (ref >=40)
LDL Cholesterol (Calc): 75 mg/dL (calc)
Non-HDL Cholesterol (Calc): 103 mg/dL (calc) (ref <130)
Total CHOL/HDL Ratio: 3.3 (calc) (ref <5.0)
Triglycerides: 182 mg/dL — ABNORMAL HIGH (ref <150)

## 2021-01-04 LAB — PSA: PSA: 1.52 ng/mL (ref <=4.00)

## 2021-01-09 ENCOUNTER — Other Ambulatory Visit: Payer: Self-pay

## 2021-01-09 ENCOUNTER — Encounter: Payer: Self-pay | Admitting: Family Medicine

## 2021-01-09 ENCOUNTER — Ambulatory Visit (INDEPENDENT_AMBULATORY_CARE_PROVIDER_SITE_OTHER): Payer: Medicare HMO | Admitting: Family Medicine

## 2021-01-09 VITALS — BP 128/62 | HR 70 | Resp 18 | Ht 73.0 in | Wt 196.0 lb

## 2021-01-09 DIAGNOSIS — H18512 Endothelial corneal dystrophy, left eye: Secondary | ICD-10-CM | POA: Diagnosis not present

## 2021-01-09 DIAGNOSIS — Z0000 Encounter for general adult medical examination without abnormal findings: Secondary | ICD-10-CM | POA: Diagnosis not present

## 2021-01-09 DIAGNOSIS — Z86010 Personal history of colonic polyps: Secondary | ICD-10-CM | POA: Diagnosis not present

## 2021-01-09 DIAGNOSIS — Z125 Encounter for screening for malignant neoplasm of prostate: Secondary | ICD-10-CM | POA: Diagnosis not present

## 2021-01-09 DIAGNOSIS — Z01818 Encounter for other preprocedural examination: Secondary | ICD-10-CM | POA: Diagnosis not present

## 2021-01-09 DIAGNOSIS — I10 Essential (primary) hypertension: Secondary | ICD-10-CM

## 2021-01-09 DIAGNOSIS — E7800 Pure hypercholesterolemia, unspecified: Secondary | ICD-10-CM

## 2021-01-09 MED ORDER — MONTELUKAST SODIUM 10 MG PO TABS: 10.0000 mg | ORAL_TABLET | Freq: Every day | ORAL | 3 refills | Status: DC

## 2021-01-09 NOTE — Progress Notes (Signed)
Subjective:    Patient ID: Michael Nguyen, male    DOB: 1940/11/18, 80 y.o.   MRN: [id number]  HPI  Patient is here today for complete physical exam.  He denies any concerns.  His immunizations are up-to-date except for the Shingrix vaccine.  Patient's age is 80 years old.  Therefore I would not recommend a repeat colonoscopy.  His PSA was checked on his labs prior to his visit today but it was excellent at 1.5.  Given his age I would not recommend prostate cancer screening.  He does report postnasal drip and drainage constantly causing him to clear his throat despite using Flonase and Allegra.  We discussed trying Singulair and he would be interested.  He denies any sinus pain.  This has been on for months.  He denies any falls.  He denies any depression.  He does report age-related memory loss.  He states, for instance, he will think to do something before going to the bathroom.  After using the bathroom he will forget what he wanted to do.  I performed a Mini-Mental status exam today and he scored 30 out of 30 excellent recall.  I believe that this is normal age-related decline in short-term memory but does not show signs of dementia.  Otherwise he is doing well with no concerns.  Most recent lab work is listed below. Lab on 01/03/2021  Component Date Value Ref Range Status   PSA 01/03/2021 1.52  < OR = 4.00 ng/mL Final   Comment: The total PSA value from this assay system is  standardized against the WHO standard. The test  result will be approximately 20% lower when compared  to the equimolar-standardized total PSA (Beckman  Coulter). Comparison of serial PSA results should be  interpreted with this fact in mind. . This test was performed using the Siemens  chemiluminescent method. Values obtained from  different assay methods cannot be used interchangeably. PSA levels, regardless of value, should not be interpreted as absolute evidence of the presence or absence of disease.     Cholesterol 01/03/2021 147  <200 mg/dL Final   HDL 01/03/2021 44  > OR = 40 mg/dL Final   Triglycerides 01/03/2021 182 (H)  <150 mg/dL Final   LDL Cholesterol (Calc) 01/03/2021 75  mg/dL (calc) Final   Comment: Reference range: <100 . Desirable range <100 mg/dL for primary prevention;   <70 mg/dL for patients with CHD or diabetic patients  with > or = 2 CHD risk factors. Marland Kitchen LDL-C is now calculated using the Martin-Hopkins  calculation, which is a validated novel method providing  better accuracy than the Friedewald equation in the  estimation of LDL-C.  Cresenciano Genre et al. Annamaria Helling. 1856;314(97): 2061-2068  (http://education.[facility].com/faq/FAQ164)    Total CHOL/HDL Ratio 01/03/2021 3.3  <5.0 (calc) Final   Non-HDL Cholesterol (Calc) 01/03/2021 103  <130 mg/dL (calc) Final   Comment: For patients with diabetes plus 1 major ASCVD risk  factor, treating to a non-HDL-C goal of <100 mg/dL  (LDL-C of <70 mg/dL) is considered a therapeutic  option.    WBC 01/03/2021 7.6  3.8 - 10.8 Thousand/uL Final   RBC 01/03/2021 5.11  4.20 - 5.80 Million/uL Final   Hemoglobin 01/03/2021 16.0  13.2 - 17.1 g/dL Final   HCT 01/03/2021 48.3  38.5 - 50.0 % Final   MCV 01/03/2021 94.5  80.0 - 100.0 fL Final   MCH 01/03/2021 31.3  27.0 - 33.0 pg Final   MCHC 01/03/2021 33.1  32.0 -  36.0 g/dL Final   RDW 01/03/2021 11.9  11.0 - 15.0 % Final   Platelets 01/03/2021 198  140 - 400 Thousand/uL Final   MPV 01/03/2021 11.6  7.5 - 12.5 fL Final   Neutro Abs 01/03/2021 4,636  1,500 - 7,800 cells/uL Final   Lymphs Abs 01/03/2021 2,189  850 - 3,900 cells/uL Final   Absolute Monocytes 01/03/2021 555  200 - 950 cells/uL Final   Eosinophils Absolute 01/03/2021 182  15 - 500 cells/uL Final   Basophils Absolute 01/03/2021 38  0 - 200 cells/uL Final   Neutrophils Relative % 01/03/2021 61  % Final   Total Lymphocyte 01/03/2021 28.8  % Final   Monocytes Relative 01/03/2021 7.3  % Final   Eosinophils Relative  01/03/2021 2.4  % Final   Basophils Relative 01/03/2021 0.5  % Final   Glucose, Bld 01/03/2021 89  65 - 99 mg/dL Final   Comment: .            Fasting reference interval .    BUN 01/03/2021 18  7 - 25 mg/dL Final   Creat 01/03/2021 1.14  0.70 - 1.22 mg/dL Final   BUN/Creatinine Ratio 62/22/9798 NOT APPLICABLE  6 - 22 (calc) Final   Sodium 01/03/2021 142  135 - 146 mmol/L Final   Potassium 01/03/2021 4.2  3.5 - 5.3 mmol/L Final   Chloride 01/03/2021 105  98 - 110 mmol/L Final   CO2 01/03/2021 29  20 - 32 mmol/L Final   Calcium 01/03/2021 10.0  8.6 - 10.3 mg/dL Final   Total Protein 01/03/2021 6.6  6.1 - 8.1 g/dL Final   Albumin 01/03/2021 4.2  3.6 - 5.1 g/dL Final   Globulin 01/03/2021 2.4  1.9 - 3.7 g/dL (calc) Final   AG Ratio 01/03/2021 1.8  1.0 - 2.5 (calc) Final   Total Bilirubin 01/03/2021 0.9  0.2 - 1.2 mg/dL Final   Alkaline phosphatase (APISO) 01/03/2021 48  35 - 144 U/L Final   AST 01/03/2021 17  10 - 35 U/L Final   ALT 01/03/2021 15  9 - 46 U/L Final    Subjective:    Review Past Medical/Family/Social: Past Medical History:  Diagnosis Date   Allergy    Arthritis    "some" per pt   BPH (benign prostatic hypertrophy)    Cancer ([facility])    skin- basal and squamous cell CA   Cataract    GERD (gastroesophageal reflux disease)    History of ETT    with dr. Acie Fredrickson was normal per patient   Hyperlipidemia    Hypertension    Past Surgical History:  Procedure Laterality Date   cataracts     both eyes   COLONOSCOPY     TONSILLECTOMY     Current Outpatient Medications on File Prior to Visit  Medication Sig Dispense Refill   ALOE VERA PO Take by mouth daily.     aspirin 81 MG tablet Take 81 mg by mouth daily.     atorvastatin (LIPITOR) 40 MG tablet Take 1 tablet by mouth once daily 90 tablet 0   Cholecalciferol (VITAMIN D3) 1000 UNITS CAPS Take 1 capsule by mouth 2 (two) times daily.     fexofenadine (ALLEGRA) 180 MG tablet Take 180 mg by mouth daily.     fish  oil-omega-3 fatty acids 1000 MG capsule Take 1 g by mouth 2 (two) times daily.     fluticasone (FLONASE) 50 MCG/ACT nasal spray Place 2 sprays into both nostrils daily.  GLUCOSAMINE-CHONDROITIN-VIT C PO Take 2,000 mg by mouth daily.     losartan (COZAAR) 100 MG tablet Take 0.5 tablets (50 mg total) by mouth daily. 90 tablet 3   Multiple Vitamin (MULTIVITAMIN) tablet Take 1 tablet by mouth daily.     naproxen sodium (ALEVE) 220 MG tablet Take 220 mg by mouth as needed.     pantoprazole (PROTONIX) 40 MG tablet Take 1 tablet by mouth once daily 90 tablet 0   HYDROcodone-acetaminophen (NORCO) 5-325 MG tablet Take 1 tablet by mouth every 6 (six) hours as needed for moderate pain. (Patient not taking: Reported on 01/09/2021) 30 tablet 0   No current facility-administered medications on file prior to visit.   Allergies  Allergen Reactions   Prednisone     REACTION: ? intolerance per patient / Cramps   Social History   Socioeconomic History   Marital status: Married    Spouse name: Not on file   Number of children: 2   Years of education: Not on file   Highest education level: Not on file  Occupational History   Occupation: Dealer  Tobacco Use   Smoking status: Former    Types: Cigarettes    Quit date: 01/23/1967    Years since quitting: 54.0   Smokeless tobacco: Never  Vaping Use   Vaping Use: Never used  Substance and Sexual Activity   Alcohol use: No   Drug use: No   Sexual activity: Not on file  Other Topics Concern   Not on file  Social History Narrative   Lives in [place] Determinants of Health   Financial Resource Strain: Not on file  Food Insecurity: Not on file  Transportation Needs: Not on file  Physical Activity: Not on file  Stress: Not on file  Social Connections: Not on file  Intimate Partner Violence: Not on file   Family History  Problem Relation Age of Onset   Coronary artery disease Brother        x2. One had CABG at 35. other with  CABG in his 79's. Father with MI in his 58's   Esophageal cancer Father    Breast cancer Mother    Colon cancer Neg Hx    Rectal cancer Neg Hx    Stomach cancer Neg Hx    Colon polyps Neg Hx       Review of Systems  All other systems reviewed and are negative.     Objective:   Physical Exam Vitals reviewed.  Constitutional:      General: He is not in acute distress.    Appearance: He is well-developed. He is not diaphoretic.  HENT:     Head: Normocephalic and atraumatic.     Right Ear: External ear normal.     Left Ear: External ear normal.     Nose: Nose normal.     Mouth/Throat:     Pharynx: No oropharyngeal exudate.  Eyes:     General: No scleral icterus.       Right eye: No discharge.        Left eye: No discharge.     Conjunctiva/sclera: Conjunctivae normal.     Pupils: Pupils are equal, round, and reactive to light.  Neck:     Thyroid: No thyromegaly.     Vascular: No JVD.     Trachea: No tracheal deviation.  Cardiovascular:     Rate and Rhythm: Normal rate and regular rhythm.     Heart sounds: Normal heart sounds.  No murmur heard.   No friction rub. No gallop.  Pulmonary:     Effort: Pulmonary effort is normal. No respiratory distress.     Breath sounds: Normal breath sounds. No stridor. No wheezing or rales.  Chest:     Chest wall: No tenderness.  Abdominal:     General: Bowel sounds are normal. There is no distension.     Palpations: Abdomen is soft. There is no mass.     Tenderness: There is no abdominal tenderness. There is no guarding or rebound.  Genitourinary:    Prostate: Not enlarged and not tender.     Rectum: Normal.  Musculoskeletal:        General: No tenderness. Normal range of motion.     Cervical back: Normal range of motion and neck supple.  Lymphadenopathy:     Cervical: No cervical adenopathy.  Skin:    General: Skin is warm.     Coloration: Skin is not pale.     Findings: No erythema or rash.  Neurological:     Mental Status:  He is alert and oriented to person, place, and time.     Cranial Nerves: No cranial nerve deficit.     Motor: No abnormal muscle tone.     Coordination: Coordination normal.     Deep Tendon Reflexes: Reflexes are normal and symmetric.  Psychiatric:        Behavior: Behavior normal.        Thought Content: Thought content normal.        Judgment: Judgment normal.          Assessment & Plan:  Routine general medical examination at a health care facility  Benign essential HTN  Prostate cancer screening  Personal history of colonic adenomas  Pure hypercholesterolemia Physical exam today is completely normal.  Blood pressure is outstanding.  There is no concern regarding falls or depression.  I do not believe his memory loss suggest dementia but rather age-related cognitive decline.  I tried to reassure the patient in this regard.  His blood pressure is excellent.  His blood work is outstanding.  We will try Singulair 10 mg daily for postnasal drip in addition to his Allegra and Flonase.  He does have a history of colon adenomas.  However given his age greater than 38 I do not feel that a colonoscopy would be beneficial to the patient at this point.  I would also recommend against further prostate cancer screening.

## 2021-01-12 DIAGNOSIS — R350 Frequency of micturition: Secondary | ICD-10-CM | POA: Diagnosis not present

## 2021-01-25 DIAGNOSIS — H18512 Endothelial corneal dystrophy, left eye: Secondary | ICD-10-CM | POA: Diagnosis not present

## 2021-02-16 DIAGNOSIS — R350 Frequency of micturition: Secondary | ICD-10-CM | POA: Diagnosis not present

## 2021-03-06 ENCOUNTER — Other Ambulatory Visit: Payer: Self-pay | Admitting: Family Medicine

## 2021-03-22 ENCOUNTER — Other Ambulatory Visit: Payer: Self-pay | Admitting: Family Medicine

## 2021-03-23 DIAGNOSIS — R350 Frequency of micturition: Secondary | ICD-10-CM | POA: Diagnosis not present

## 2021-03-29 DIAGNOSIS — L814 Other melanin hyperpigmentation: Secondary | ICD-10-CM | POA: Diagnosis not present

## 2021-03-29 DIAGNOSIS — D2262 Melanocytic nevi of left upper limb, including shoulder: Secondary | ICD-10-CM | POA: Diagnosis not present

## 2021-03-29 DIAGNOSIS — L821 Other seborrheic keratosis: Secondary | ICD-10-CM | POA: Diagnosis not present

## 2021-03-29 DIAGNOSIS — D1801 Hemangioma of skin and subcutaneous tissue: Secondary | ICD-10-CM | POA: Diagnosis not present

## 2021-03-29 DIAGNOSIS — D2271 Melanocytic nevi of right lower limb, including hip: Secondary | ICD-10-CM | POA: Diagnosis not present

## 2021-03-29 DIAGNOSIS — D225 Melanocytic nevi of trunk: Secondary | ICD-10-CM | POA: Diagnosis not present

## 2021-03-29 DIAGNOSIS — D2272 Melanocytic nevi of left lower limb, including hip: Secondary | ICD-10-CM | POA: Diagnosis not present

## 2021-03-29 DIAGNOSIS — Z85828 Personal history of other malignant neoplasm of skin: Secondary | ICD-10-CM | POA: Diagnosis not present

## 2021-03-29 DIAGNOSIS — L570 Actinic keratosis: Secondary | ICD-10-CM | POA: Diagnosis not present

## 2021-04-25 DIAGNOSIS — R350 Frequency of micturition: Secondary | ICD-10-CM | POA: Diagnosis not present

## 2021-04-29 ENCOUNTER — Other Ambulatory Visit: Payer: Self-pay | Admitting: Family Medicine

## 2021-05-09 ENCOUNTER — Other Ambulatory Visit: Payer: Self-pay | Admitting: Family Medicine

## 2021-05-23 DIAGNOSIS — R350 Frequency of micturition: Secondary | ICD-10-CM | POA: Diagnosis not present

## 2021-06-07 ENCOUNTER — Other Ambulatory Visit: Payer: Self-pay | Admitting: Family Medicine

## 2021-06-08 NOTE — Telephone Encounter (Signed)
Last OV 01/12/21, will refill medication.   Requested Prescriptions  Pending Prescriptions Disp Refills  . pantoprazole (PROTONIX) 40 MG tablet [Pharmacy Med Name: Pantoprazole Sodium 40 MG Oral Tablet Delayed Release] 90 tablet 0    Sig: Take 1 tablet by mouth once daily     Gastroenterology: Proton Pump Inhibitors Failed - 06/07/2021  4:22 PM      Failed - Valid encounter within last 12 months    Recent Outpatient Visits          5 months ago Routine general medical examination at a health care facility   [place], Cammie Mcgee, MD   1 year ago Routine general medical examination at a health care facility   [place], Cammie Mcgee, MD   1 year ago Rib pain on left side   [place] [facility], Cammie Mcgee, MD   2 years ago Routine general medical examination at a health care facility   [place], Cammie Mcgee, MD   3 years ago Chronic frontal sinusitis   [place], Cammie Mcgee, MD

## 2021-06-22 ENCOUNTER — Other Ambulatory Visit: Payer: Self-pay | Admitting: Family Medicine

## 2021-06-23 NOTE — Telephone Encounter (Signed)
Requested Prescriptions  Pending Prescriptions Disp Refills  . atorvastatin (LIPITOR) 40 MG tablet [Pharmacy Med Name: Atorvastatin Calcium 40 MG Oral Tablet] 90 tablet 1    Sig: Take 1 tablet by mouth once daily     Cardiovascular:  Antilipid - Statins Failed - 06/22/2021  9:27 AM      Failed - Valid encounter within last 12 months    Recent Outpatient Visits          5 months ago Routine general medical examination at a health care facility   [place], Cammie Mcgee, MD   1 year ago Routine general medical examination at a health care facility   [place], Cammie Mcgee, MD   1 year ago Rib pain on left side   [place] Dennard Schaumann, Cammie Mcgee, MD   2 years ago Routine general medical examination at a health care facility   [place], Cammie Mcgee, MD   3 years ago Chronic frontal sinusitis   [place] Susy Frizzle, MD             Failed - Lipid Panel in normal range within the last 12 months    Cholesterol  Date Value Ref Range Status  01/03/2021 147 <200 mg/dL Final   LDL Cholesterol (Calc)  Date Value Ref Range Status  01/03/2021 75 mg/dL (calc) Final    Comment:    Reference range: <100 . Desirable range <100 mg/dL for primary prevention;   <70 mg/dL for patients with CHD or diabetic patients  with > or = 2 CHD risk factors. Marland Kitchen LDL-C is now calculated using the Martin-Hopkins  calculation, which is a validated novel method providing  better accuracy than the Friedewald equation in the  estimation of LDL-C.  Cresenciano Genre et al. Annamaria Helling. 4332;951(88): 2061-2068  (http://education.[facility].com/faq/FAQ164)    HDL  Date Value Ref Range Status  01/03/2021 44 > OR = 40 mg/dL Final   Triglycerides  Date Value Ref Range Status  01/03/2021 182 (H) <150 mg/dL Final         Passed - Patient is not pregnant

## 2021-06-23 NOTE — Telephone Encounter (Signed)
refilled 06/23/21 #90 1 RF Requested Prescriptions  Refused Prescriptions Disp Refills  . atorvastatin (LIPITOR) 40 MG tablet [Pharmacy Med Name: Atorvastatin Calcium 40 MG Oral Tablet] 90 tablet 0    Sig: Take 1 tablet by mouth once daily     Cardiovascular:  Antilipid - Statins Failed - 06/22/2021  6:16 PM      Failed - Valid encounter within last 12 months    Recent Outpatient Visits          5 months ago Routine general medical examination at a health care facility   [place], Cammie Mcgee, MD   1 year ago Routine general medical examination at a health care facility   [place], Cammie Mcgee, MD   1 year ago Rib pain on left side   [place] Denn[facility] Schaumann, Cammie Mcgee, MD   2 years ago Routine general medical examination at a health care facility   [place], Cammie Mcgee, MD   3 years ago Chronic frontal sinusitis   [place] Susy Frizzle, MD             Failed - Lipid Panel in normal range within the last 12 months    Cholesterol  Date Value Ref Range Status  01/03/2021 147 <200 mg/dL Final   LDL Cholesterol (Calc)  Date Value Ref Range Status  01/03/2021 75 mg/dL (calc) Final    Comment:    Reference range: <100 . Desirable range <100 mg/dL for primary prevention;   <70 mg/dL for patients with CHD or diabetic patients  with > or = 2 CHD risk factors. Marland Kitchen LDL-C is now calculated using the Martin-Hopkins  calculation, which is a validated novel method providing  better accuracy than the Friedewald equation in the  estimation of LDL-C.  Cresenciano Genre et al. Annamaria Helling. 4580;998(33): 2061-2068  (http://education.[facility].com/faq/FAQ164)    HDL  Date Value Ref Range Status  01/03/2021 44 > OR = 40 mg/dL Final   Triglycerides  Date Value Ref Range Status  01/03/2021 182 (H) <150 mg/dL Final         Passed - Patient is not pregnant

## 2021-06-29 DIAGNOSIS — R350 Frequency of micturition: Secondary | ICD-10-CM | POA: Diagnosis not present

## 2021-07-27 DIAGNOSIS — R350 Frequency of micturition: Secondary | ICD-10-CM | POA: Diagnosis not present

## 2021-07-27 DIAGNOSIS — R351 Nocturia: Secondary | ICD-10-CM | POA: Diagnosis not present

## 2021-08-03 DIAGNOSIS — R350 Frequency of micturition: Secondary | ICD-10-CM | POA: Diagnosis not present

## 2021-09-06 ENCOUNTER — Other Ambulatory Visit: Payer: Self-pay | Admitting: Family Medicine

## 2021-09-06 NOTE — Telephone Encounter (Signed)
Requested Prescriptions  Pending Prescriptions Disp Refills  . pantoprazole (PROTONIX) 40 MG tablet [Pharmacy Med Name: Pantoprazole Sodium 40 MG Oral Tablet Delayed Release] 90 tablet 0    Sig: Take 1 tablet by mouth once daily     Gastroenterology: Proton Pump Inhibitors Failed - 09/06/2021  9:09 AM      Failed - Valid encounter within last 12 months    Recent Outpatient Visits          8 months ago Routine general medical examination at a health care facility   [place], Cammie Mcgee, MD   1 year ago Routine general medical examination at a health care facility   [place], Warren T, MD   2 years ago Rib pain on left side   [place] Denn[facility] Schaumann, Cammie Mcgee, MD   2 years ago Routine general medical examination at a health care facility   [place], Cammie Mcgee, MD   3 years ago Chronic frontal sinusitis   [place] Pickard, Cammie Mcgee, MD

## 2021-09-07 DIAGNOSIS — R350 Frequency of micturition: Secondary | ICD-10-CM | POA: Diagnosis not present

## 2021-10-12 DIAGNOSIS — R350 Frequency of micturition: Secondary | ICD-10-CM | POA: Diagnosis not present

## 2021-10-23 DIAGNOSIS — H18511 Endothelial corneal dystrophy, right eye: Secondary | ICD-10-CM | POA: Diagnosis not present

## 2021-10-23 DIAGNOSIS — H18512 Endothelial corneal dystrophy, left eye: Secondary | ICD-10-CM | POA: Diagnosis not present

## 2021-11-22 ENCOUNTER — Other Ambulatory Visit: Payer: Self-pay | Admitting: Family Medicine

## 2021-11-22 NOTE — Telephone Encounter (Signed)
Requested Prescriptions  Pending Prescriptions Disp Refills  . montelukast (SINGULAIR) 10 MG tablet [Pharmacy Med Name: Montelukast Sodium 10 MG Oral Tablet] 90 tablet 0    Sig: Take 1 tablet (10 mg total) by mouth at bedtime. OFFICE VISIT NEEDED FOR ADDITIONAL REFILLS     Pulmonology:  Leukotriene Inhibitors Failed - 11/22/2021 11:51 AM      Failed - Valid encounter within last 12 months    Recent Outpatient Visits          10 months ago Routine general medical examination at a health care facility   [place], Cammie Mcgee, MD   1 year ago Routine general medical examination at a health care facility   [place], Warren T, MD   2 years ago Rib pain on left side   [place] Denn[facility] Schaumann, Cammie Mcgee, MD   3 years ago Routine general medical examination at a health care facility   [place], Cammie Mcgee, MD   3 years ago Chronic frontal sinusitis   [place] Pickard, Cammie Mcgee, MD

## 2021-11-23 DIAGNOSIS — R350 Frequency of micturition: Secondary | ICD-10-CM | POA: Diagnosis not present

## 2021-12-06 ENCOUNTER — Other Ambulatory Visit: Payer: Self-pay | Admitting: Family Medicine

## 2021-12-06 NOTE — Telephone Encounter (Signed)
Requested Prescriptions  Pending Prescriptions Disp Refills   pantoprazole (PROTONIX) 40 MG tablet [Pharmacy Med Name: Pantoprazole Sodium 40 MG Oral Tablet Delayed Release] 90 tablet 0    Sig: Take 1 tablet by mouth once daily     Gastroenterology: Proton Pump Inhibitors Failed - 12/06/2021 12:07 PM      Failed - Valid encounter within last 12 months    Recent Outpatient Visits           11 months ago Routine general medical examination at a health care facility   [place], Cammie Mcgee, MD   1 year ago Routine general medical examination at a health care facility   [place], Warren T, MD   2 years ago Rib pain on left side   [place] Denn[facility] Schaumann, Cammie Mcgee, MD   3 years ago Routine general medical examination at a health care facility   [place], Cammie Mcgee, MD   3 years ago Chronic frontal sinusitis   [place] Pickard, Cammie Mcgee, MD

## 2021-12-20 ENCOUNTER — Other Ambulatory Visit: Payer: Self-pay | Admitting: Family Medicine

## 2021-12-20 ENCOUNTER — Other Ambulatory Visit: Payer: Self-pay

## 2021-12-20 ENCOUNTER — Ambulatory Visit
Admission: RE | Admit: 2021-12-20 | Discharge: 2021-12-20 | Disposition: A | Discharge status: Home / Self Care | Payer: Medicare HMO | Source: Ambulatory Visit | Attending: Nurse Practitioner | Admitting: Nurse Practitioner

## 2021-12-20 ENCOUNTER — Ambulatory Visit (INDEPENDENT_AMBULATORY_CARE_PROVIDER_SITE_OTHER): Payer: Medicare HMO

## 2021-12-20 VITALS — BP 130/82 | HR 98 | Temp 98.4°F | Resp 20

## 2021-12-20 DIAGNOSIS — R1011 Right upper quadrant pain: Secondary | ICD-10-CM | POA: Diagnosis not present

## 2021-12-20 DIAGNOSIS — R140 Abdominal distension (gaseous): Secondary | ICD-10-CM

## 2021-12-20 DIAGNOSIS — R109 Unspecified abdominal pain: Secondary | ICD-10-CM

## 2021-12-20 MED ORDER — ALUM & MAG HYDROXIDE-SIMETH 200-200-20 MG/5ML PO SUSP
30.0000 mL | Freq: Once | ORAL | Status: AC
  Administered 2021-12-20: 30 mL via ORAL

## 2021-12-20 NOTE — Discharge Instructions (Signed)
The abdominal x-ray today does not show a bowel obstruction.  It shows there is a lot of gas in your colon.  I recommend you follow-up with your primary care provider within the next few days - call first thing tomorrow to see when you can get an appointment.  We have given you Maalox/Mylanta and simethicone today to help with the indigestion/gas feeling.  You can continue this at home or take Tums for the bloating/gurgling.  I recommend you start a clear liquid diet for the next few days and see if this helps resolve some of the bloating and diarrhea.  We have checked some blood work and will call you tomorrow with abnormal results.  If you develop severe abdominal pain, nausea/vomiting and are unable to keep fluids or food or fluids down, or if your abdomen starts growing in size, please go directly to the emergency room or call 911.

## 2021-12-20 NOTE — ED Triage Notes (Signed)
Pt reports right sided abd pain with intermittent diarrhea x1 week. Denies any emesis, rectal bleeding,fever. Pt reports stool has transitioned to "like water" today.

## 2021-12-20 NOTE — ED Provider Notes (Signed)
[facility]    CSN: [id number] Arrival date & time: 12/20/21  1513      History   Chief Complaint Chief Complaint  Patient presents with   Abdominal Pain    Pain in upper right side and bloating. - Entered by patient    HPI Michael Nguyen is a 81 y.o. male.   Patient presents today for right upper quadrant abdominal pain with bloating and diarrhea ongoing for the past few days.  Reports the pain has come on each time at night when he is in his recliner.  Reports the pain onset is sudden and severe.  The pain is sharp and lasts 10 to 15 minutes, then fully resolves.  He also has intermittent bloating/gurgling of his intestines.  Reports his appetite has not been where it normally is, usually he can eat a meal and a half and eat last night, only ate half of his normal meal.  The pain denies pain radiating around to his back or any other part of his abdomen.  Reports the diarrhea has been becoming more frequent.  He has gone to the bathroom 4 times at least today reports there is still some formed pieces in his stool, however it is getting very loose.  Denies any blood in his stool.  No recent fever, nausea/vomiting.  Reports he has had some increase in burping and indigestion.  No decreased appetite, constipation, blood in the stool, or heartburn.  No new rash, dysuria/urinary frequency, or hematuria.  No frequent NSAID use.  He has taken Gas-X without much improvement.  No known aggravating factors.    Past Medical History:  Diagnosis Date   Allergy    Arthritis    "some" per pt   BPH (benign prostatic hypertrophy)    Cancer (HCC)    skin- basal and squamous cell CA   Cataract    GERD (gastroesophageal reflux disease)    History of ETT    with dr. Acie Fredrickson was normal per patient   Hyperlipidemia    Hypertension     Patient Active Problem List   Diagnosis Date Noted   HYPERLIPIDEMIA-MIXED 03/24/2009   Personal history of colonic adenomas 04/17/2007    Past  Surgical History:  Procedure Laterality Date   cataracts     both eyes   COLONOSCOPY     TONSILLECTOMY         Home Medications    Prior to Admission medications   Medication Sig Start Date End Date Taking? Authorizing Provider  ALOE VERA PO Take by mouth daily.    [provider]  aspirin 81 MG tablet Take 81 mg by mouth daily.    [provider]  atorvastatin (LIPITOR) 40 MG tablet Take 1 tablet by mouth once daily 12/20/21   Susy Frizzle, MD  Cholecalciferol (VITAMIN D3) 1000 UNITS CAPS Take 1 capsule by mouth 2 (two) times daily.    [provider]  fexofenadine (ALLEGRA) 180 MG tablet Take 180 mg by mouth daily.    [provider]  fish oil-omega-3 fatty acids 1000 MG capsule Take 1 g by mouth 2 (two) times daily.    [provider]  fluticasone (FLONASE) 50 MCG/ACT nasal spray Place 2 sprays into both nostrils daily.    [provider]  GLUCOSAMINE-CHONDROITIN-VIT C PO Take 2,000 mg by mouth daily.    [provider]  HYDROcodone-acetaminophen (NORCO) 5-325 MG tablet Take 1 tablet by mouth every 6 (six) hours as needed for  moderate pain. Patient not taking: Reported on 01/09/2021 08/20/19   Susy Frizzle, MD  losartan (COZAAR) 100 MG tablet Take 1/2 (one-half) tablet by mouth once daily 05/01/21   Susy Frizzle, MD  montelukast (SINGULAIR) 10 MG tablet Take 1 tablet (10 mg total) by mouth at bedtime. OFFICE VISIT NEEDED FOR ADDITIONAL REFILLS 11/22/21   Susy Frizzle, MD  Multiple Vitamin (MULTIVITAMIN) tablet Take 1 tablet by mouth daily.    [provider]  naproxen sodium (ALEVE) 220 MG tablet Take 220 mg by mouth as needed.    [provider]  pantoprazole (PROTONIX) 40 MG tablet Take 1 tablet by mouth once daily 12/06/21   Susy Frizzle, MD    Family History Family History  Problem Relation Age of Onset   Coronary artery disease Brother        x2. One had CABG at 107. other  with CABG in his 40's. Father with MI in his 28's   Esophageal cancer Father    Breast cancer Mother    Colon cancer Neg Hx    Rectal cancer Neg Hx    Stomach cancer Neg Hx    Colon polyps Neg Hx     Social History Social History   Tobacco Use   Smoking status: Former    Types: Cigarettes    Quit date: 01/23/1967    Years since quitting: 54.9   Smokeless tobacco: Never  Vaping Use   Vaping Use: Never used  Substance Use Topics   Alcohol use: No   Drug use: No     Allergies   Prednisone   Review of Systems Review of Systems Per HPI  Physical Exam Triage Vital Signs ED Triage Vitals [12/20/21 1553]  Enc Vitals Group     BP 130/82     Pulse Rate 98     Resp 20     Temp 98.4 F (36.9 C)     Temp Source Oral     SpO2 95 %     Weight      Height      Head Circumference      Peak Flow      Pain Score 0     Pain Loc      Pain Edu?      Excl. in [place]?    No data found.  Updated Vital Signs BP 130/82 (BP Location: Right Arm)   Pulse 98   Temp 98.4 F (36.9 C) (Oral)   Resp 20   SpO2 95%   Visual Acuity Right Eye Distance:   Left Eye Distance:   Bilateral Distance:    Right Eye Near:   Left Eye Near:    Bilateral Near:     Physical Exam Vitals and nursing note reviewed.  Constitutional:      General: He is not in acute distress.    Appearance: Normal appearance. He is not toxic-appearing.  HENT:     Head: Normocephalic and atraumatic.     Mouth/Throat:     Mouth: Mucous membranes are moist.     Pharynx: Oropharynx is clear. No posterior oropharyngeal erythema.  Eyes:     General: No scleral icterus. Cardiovascular:     Rate and Rhythm: Normal rate and regular rhythm.  Pulmonary:     Effort: Pulmonary effort is normal. No respiratory distress.     Breath sounds: Normal breath sounds. No wheezing, rhonchi or rales.  Abdominal:     General: Abdomen is flat. Bowel sounds  are increased. There is no distension.     Palpations: Abdomen is soft.      Tenderness: There is no abdominal tenderness. There is no right CVA tenderness, left CVA tenderness, guarding or rebound. Negative signs include Murphy's sign, Rovsing's sign and McBurney's sign.  Musculoskeletal:     Cervical back: Normal range of motion.  Lymphadenopathy:     Cervical: No cervical adenopathy.  Skin:    General: Skin is warm and dry.     Capillary Refill: Capillary refill takes less than 2 seconds.     Coloration: Skin is not jaundiced or pale.     Findings: No erythema or rash.  Neurological:     Mental Status: He is alert.     Motor: No weakness.     Gait: Gait normal.  Psychiatric:        Behavior: Behavior is cooperative.      UC Treatments / Results  Labs (all labs ordered are listed, but only abnormal results are displayed) Labs Reviewed  COMPREHENSIVE METABOLIC PANEL  CBC WITH DIFFERENTIAL/PLATELET    EKG   Radiology DG Abd 1 View  Result Date: 12/20/2021 CLINICAL DATA:  Right-sided abdominal pain, intermittent diarrhea for 1 week EXAM: ABDOMEN - 1 VIEW COMPARISON:  None Available. FINDINGS: Two upright frontal views of the abdomen and pelvis are obtained. There is a relative paucity of small bowel gas. Scattered gas fluid levels are seen within the small bowel in the right hemiabdomen as well as within the proximal colon. There are no masses or abnormal calcifications. No free gas in the greater peritoneal sac. Elevation of the left hemidiaphragm. Bilateral hip osteoarthritis left greater than right. Prominent lower lumbar spondylosis and facet hypertrophy. IMPRESSION: 1. Nonspecific bowel gas pattern, a relative paucity of small bowel gas as well as gas fluid levels within the distal small bowel and proximal colon. Findings are consistent with given history of diarrhea, with no evidence of high-grade obstruction at this time. Continued radiographic follow-up may be useful. Electronically Signed   By: Randa Ngo M.D.   On: 12/20/2021 16:42     Procedures Procedures (including critical care time)  Medications Ordered in UC Medications  alum & mag hydroxide-simeth (MAALOX/MYLANTA) 200-200-20 MG/5ML suspension 30 mL (30 mLs Oral Given 12/20/21 1701)    Initial Impression / Assessment and Plan / UC Course  I have reviewed the triage vital signs and the nursing notes.  Pertinent labs & imaging results that were available during my care of the patient were reviewed by me and considered in my medical decision making (see chart for details).   Patient is well-appearing, normotensive, afebrile, not tachycardic, not tachypneic, oxygenating well on room air.    RUQ pain Bloating Unclear etiology today; differentials include bowel obstruction, gastroenteritis, GERD, right upper quadrant etiology such as cholecystitis, gallstones. Abdominal x-ray today shows small amount of small bowel gas, gas/fluid levels within the distal small bowel and proximal colon, which could be attributed to the diarrhea per radiologist report. On examination, abdomen is soft, nontender to light and deep palpation.  Bowel sounds are increased. CMP obtained to check liver function, CBC obtained to check for leukocytosis GI cocktail given which did seem to help with some of the bloating and indigestion Discussed strict ER precautions with the patient Recommended liquid diet for the next couple of days to ensure bowel rest Can continue Gas-X, Tums/Maalox/Mylanta as needed Recommended close follow-up with primary care provider later this week or early next week If symptoms worsen  at all, call 911 or go right to the hospital  The patient was given the opportunity to ask questions.  All questions answered to their satisfaction.  The patient is in agreement to this plan.    Final Clinical Impressions(s) / UC Diagnoses   Final diagnoses:  RUQ pain  Bloating     Discharge Instructions      The abdominal x-ray today does not show a bowel obstruction.  It  shows there is a lot of gas in your colon.  I recommend you follow-up with your primary care provider within the next few days - call first thing tomorrow to see when you can get an appointment.  We have given you Maalox/Mylanta and simethicone today to help with the indigestion/gas feeling.  You can continue this at home or take Tums for the bloating/gurgling.  I recommend you start a clear liquid diet for the next few days and see if this helps resolve some of the bloating and diarrhea.  We have checked some blood work and will call you tomorrow with abnormal results.  If you develop severe abdominal pain, nausea/vomiting and are unable to keep fluids or food or fluids down, or if your abdomen starts growing in size, please go directly to the emergency room or call 911.     ED Prescriptions   None    PDMP not reviewed this encounter.   Eulogio Bear, NP 12/20/21 [phone]

## 2021-12-21 LAB — CBC WITH DIFFERENTIAL/PLATELET
Basophils Absolute: 0 10*3/uL (ref 0.0–0.2)
Basos: 0 %
EOS (ABSOLUTE): 0.1 10*3/uL (ref 0.0–0.4)
Eos: 2 %
Hematocrit: 46.9 % (ref 37.5–51.0)
Hemoglobin: 16.2 g/dL (ref 13.0–17.7)
Immature Grans (Abs): 0 10*3/uL (ref 0.0–0.1)
Immature Granulocytes: 0 %
Lymphocytes Absolute: 1.6 10*3/uL (ref 0.7–3.1)
Lymphs: 18 %
MCH: 31.5 pg (ref 26.6–33.0)
MCHC: 34.5 g/dL (ref 31.5–35.7)
MCV: 91 fL (ref 79–97)
Monocytes Absolute: 0.8 10*3/uL (ref 0.1–0.9)
Monocytes: 9 %
Neutrophils Absolute: 6.1 10*3/uL (ref 1.4–7.0)
Neutrophils: 71 %
Platelets: 188 10*3/uL (ref 150–450)
RBC: 5.15 x10E6/uL (ref 4.14–5.80)
RDW: 12.4 % (ref 11.6–15.4)
WBC: 8.6 10*3/uL (ref 3.4–10.8)

## 2021-12-21 LAB — COMPREHENSIVE METABOLIC PANEL
ALT: 58 IU/L — ABNORMAL HIGH (ref 0–44)
AST: 43 IU/L — ABNORMAL HIGH (ref 0–40)
Albumin/Globulin Ratio: 1.9 (ref 1.2–2.2)
Albumin: 4.6 g/dL (ref 3.7–4.7)
Alkaline Phosphatase: 59 IU/L (ref 44–121)
BUN/Creatinine Ratio: 17 (ref 10–24)
BUN: 19 mg/dL (ref 8–27)
Bilirubin Total: 0.4 mg/dL (ref 0.0–1.2)
CO2: 15 mmol/L — ABNORMAL LOW (ref 20–29)
Calcium: 9.4 mg/dL (ref 8.6–10.2)
Chloride: 106 mmol/L (ref 96–106)
Creatinine, Ser: 1.11 mg/dL (ref 0.76–1.27)
Globulin, Total: 2.4 g/dL (ref 1.5–4.5)
Glucose: 99 mg/dL (ref 70–99)
Potassium: 4.5 mmol/L (ref 3.5–5.2)
Sodium: 145 mmol/L — ABNORMAL HIGH (ref 134–144)
Total Protein: 7 g/dL (ref 6.0–8.5)
eGFR: 67 mL/min/{1.73_m2} (ref >59)

## 2021-12-26 ENCOUNTER — Telehealth: Payer: Self-pay | Admitting: Family Medicine

## 2021-12-26 NOTE — Telephone Encounter (Signed)
Left message for patient to call back and schedule Medicare Annual Wellness Visit (AWV) in office.   If not able to come in office, please offer to do virtually or by telephone.   Last AWV: 01/09/2021   Please schedule at anytime with [facility]  If any questions, please contact me at [phone].  Thank you ,  Colletta [place]

## 2021-12-28 DIAGNOSIS — R350 Frequency of micturition: Secondary | ICD-10-CM | POA: Diagnosis not present

## 2022-01-24 ENCOUNTER — Other Ambulatory Visit: Payer: Medicare HMO

## 2022-01-24 DIAGNOSIS — E7800 Pure hypercholesterolemia, unspecified: Secondary | ICD-10-CM

## 2022-01-24 DIAGNOSIS — I10 Essential (primary) hypertension: Secondary | ICD-10-CM

## 2022-01-24 DIAGNOSIS — Z125 Encounter for screening for malignant neoplasm of prostate: Secondary | ICD-10-CM | POA: Diagnosis not present

## 2022-01-29 ENCOUNTER — Other Ambulatory Visit: Payer: Self-pay | Admitting: Family Medicine

## 2022-01-29 NOTE — Patient Instructions (Signed)
[name] , Thank you for taking time to come for your Medicare Wellness Visit. I appreciate your ongoing commitment to your health goals. Please review the following plan we discussed and let me know if I can assist you in the future.   These are the goals we discussed:  Goals   None     This is a list of the screening recommended for you and due dates:  Health Maintenance  Topic Date Due   DTaP/Tdap/Td vaccine (2 - Tdap) 10/04/2014   Colon Cancer Screening  07/04/2020   Zoster (Shingles) Vaccine (2 of 2) 04/12/2021   Flu Shot  08/22/2021   COVID-19 Vaccine (2 - 2023-24 season) 09/22/2021   Medicare Annual Wellness Visit  01/09/2022   Pneumonia Vaccine  Completed   HPV Vaccine  Aged Out    Advanced directives: Advance directive discussed with you today. I have provided a copy for you to complete at home and have notarized. Once this is complete please bring a copy in to our office so we can scan it into your chart.   Conditions/risks identified: Aim for 30 minutes of exercise or brisk walking, 6-8 glasses of water, and 5 servings of fruits and vegetables each day.   Next appointment: Follow up in one year for your annual wellness visit.   Preventive Care 69 Years and Older, Male  Preventive care refers to lifestyle choices and visits with your health care provider that can promote health and wellness. What does preventive care include? A yearly physical exam. This is also called an annual well check. Dental exams once or twice a year. Routine eye exams. Ask your health care provider how often you should have your eyes checked. Personal lifestyle choices, including: Daily care of your teeth and gums. Regular physical activity. Eating a healthy diet. Avoiding tobacco and drug use. Limiting alcohol use. Practicing safe sex. Taking low doses of aspirin every day. Taking vitamin and mineral supplements as recommended by your health care provider. What happens during an annual  well check? The services and screenings done by your health care provider during your annual well check will depend on your age, overall health, lifestyle risk factors, and family history of disease. Counseling  Your health care provider may ask you questions about your: Alcohol use. Tobacco use. Drug use. Emotional well-being. Home and relationship well-being. Sexual activity. Eating habits. History of falls. Memory and ability to understand (cognition). Work and work Statistician. Screening  You may have the following tests or measurements: Height, weight, and BMI. Blood pressure. Lipid and cholesterol levels. These may be checked every 5 years, or more frequently if you are over 36 years old. Skin check. Lung cancer screening. You may have this screening every year starting at age 28 if you have a 30-pack-year history of smoking and currently smoke or have quit within the past 15 years. Fecal occult blood test (FOBT) of the stool. You may have this test every year starting at age 43. Flexible sigmoidoscopy or colonoscopy. You may have a sigmoidoscopy every 5 years or a colonoscopy every 10 years starting at age 47. Prostate cancer screening. Recommendations will vary depending on your family history and other risks. Hepatitis C blood test. Hepatitis B blood test. Sexually transmitted disease (STD) testing. Diabetes screening. This is done by checking your blood sugar (glucose) after you have not eaten for a while (fasting). You may have this done every 1-3 years. Abdominal aortic aneurysm (AAA) screening. You may need this if you  are a current or former smoker. Osteoporosis. You may be screened starting at age 23 if you are at high risk. Talk with your health care provider about your test results, treatment options, and if necessary, the need for more tests. Vaccines  Your health care provider may recommend certain vaccines, such as: Influenza vaccine. This is recommended every  year. Tetanus, diphtheria, and acellular pertussis (Tdap, Td) vaccine. You may need a Td booster every 10 years. Zoster vaccine. You may need this after age 11. Pneumococcal 13-valent conjugate (PCV13) vaccine. One dose is recommended after age 7. Pneumococcal polysaccharide (PPSV23) vaccine. One dose is recommended after age 89. Talk to your health care provider about which screenings and vaccines you need and how often you need them. This information is not intended to replace advice given to you by your health care provider. Make sure you discuss any questions you have with your health care provider. Document Released: 02/04/2015 Document Revised: 09/28/2015 Document Reviewed: 11/09/2014 Elsevier Interactive Patient Education  2017 [place] Prevention in the Home Falls can cause injuries. They can happen to people of all ages. There are many things you can do to make your home safe and to help prevent falls. What can I do on the outside of my home? Regularly fix the edges of walkways and driveways and fix any cracks. Remove anything that might make you trip as you walk through a door, such as a raised step or threshold. Trim any bushes or trees on the path to your home. Use bright outdoor lighting. Clear any walking paths of anything that might make someone trip, such as rocks or tools. Regularly check to see if handrails are loose or broken. Make sure that both sides of any steps have handrails. Any raised decks and porches should have guardrails on the edges. Have any leaves, snow, or ice cleared regularly. Use sand or salt on walking paths during winter. Clean up any spills in your garage right away. This includes oil or grease spills. What can I do in the bathroom? Use night lights. Install grab bars by the toilet and in the tub and shower. Do not use towel bars as grab bars. Use non-skid mats or decals in the tub or shower. If you need to sit down in the shower, use a  plastic, non-slip stool. Keep the floor dry. Clean up any water that spills on the floor as soon as it happens. Remove soap buildup in the tub or shower regularly. Attach bath mats securely with double-sided non-slip rug tape. Do not have throw rugs and other things on the floor that can make you trip. What can I do in the bedroom? Use night lights. Make sure that you have a light by your bed that is easy to reach. Do not use any sheets or blankets that are too big for your bed. They should not hang down onto the floor. Have a firm chair that has side arms. You can use this for support while you get dressed. Do not have throw rugs and other things on the floor that can make you trip. What can I do in the kitchen? Clean up any spills right away. Avoid walking on wet floors. Keep items that you use a lot in easy-to-reach places. If you need to reach something above you, use a strong step stool that has a grab bar. Keep electrical cords out of the way. Do not use floor polish or wax that makes floors slippery. If you  must use wax, use non-skid floor wax. Do not have throw rugs and other things on the floor that can make you trip. What can I do with my stairs? Do not leave any items on the stairs. Make sure that there are handrails on both sides of the stairs and use them. Fix handrails that are broken or loose. Make sure that handrails are as long as the stairways. Check any carpeting to make sure that it is firmly attached to the stairs. Fix any carpet that is loose or worn. Avoid having throw rugs at the top or bottom of the stairs. If you do have throw rugs, attach them to the floor with carpet tape. Make sure that you have a light switch at the top of the stairs and the bottom of the stairs. If you do not have them, ask someone to add them for you. What else can I do to help prevent falls? Wear shoes that: Do not have high heels. Have rubber bottoms. Are comfortable and fit you  well. Are closed at the toe. Do not wear sandals. If you use a stepladder: Make sure that it is fully opened. Do not climb a closed stepladder. Make sure that both sides of the stepladder are locked into place. Ask someone to hold it for you, if possible. Clearly mark and make sure that you can see: Any grab bars or handrails. First and last steps. Where the edge of each step is. Use tools that help you move around (mobility aids) if they are needed. These include: Canes. Walkers. Scooters. Crutches. Turn on the lights when you go into a dark area. Replace any light bulbs as soon as they burn out. Set up your furniture so you have a clear path. Avoid moving your furniture around. If any of your floors are uneven, fix them. If there are any pets around you, be aware of where they are. Review your medicines with your doctor. Some medicines can make you feel dizzy. This can increase your chance of falling. Ask your doctor what other things that you can do to help prevent falls. This information is not intended to replace advice given to you by your health care provider. Make sure you discuss any questions you have with your health care provider. Document Released: 11/04/2008 Document Revised: 06/16/2015 Document Reviewed: 02/12/2014 Elsevier Interactive Patient Education  2017 [employer].

## 2022-01-29 NOTE — Progress Notes (Unsigned)
Subjective:   Michael Nguyen is a 82 y.o. male who presents for Medicare Annual/Subsequent preventive examination.  Review of Systems    ***       Objective:    There were no vitals filed for this visit. There is no height or weight on file to calculate BMI.     01/07/2020   11:04 AM 07/04/2017    7:48 AM 10/08/2016   10:36 AM  Advanced Directives  Does Patient Have a Medical Advance Directive? No;Yes No No  Would patient like information on creating a medical advance directive?   No - Patient declined    Current Medications (verified) Outpatient Encounter Medications as of 01/30/2022  Medication Sig   ALOE VERA PO Take by mouth daily.   aspirin 81 MG tablet Take 81 mg by mouth daily.   atorvastatin (LIPITOR) 40 MG tablet Take 1 tablet by mouth once daily   Cholecalciferol (VITAMIN D3) 1000 UNITS CAPS Take 1 capsule by mouth 2 (two) times daily.   fexofenadine (ALLEGRA) 180 MG tablet Take 180 mg by mouth daily.   fish oil-omega-3 fatty acids 1000 MG capsule Take 1 g by mouth 2 (two) times daily.   fluticasone (FLONASE) 50 MCG/ACT nasal spray Place 2 sprays into both nostrils daily.   GLUCOSAMINE-CHONDROITIN-VIT C PO Take 2,000 mg by mouth daily.   HYDROcodone-acetaminophen (NORCO) 5-325 MG tablet Take 1 tablet by mouth every 6 (six) hours as needed for moderate pain. (Patient not taking: Reported on 01/09/2021)   losartan (COZAAR) 100 MG tablet Take 1/2 (one-half) tablet by mouth once daily   montelukast (SINGULAIR) 10 MG tablet Take 1 tablet (10 mg total) by mouth at bedtime. OFFICE VISIT NEEDED FOR ADDITIONAL REFILLS   Multiple Vitamin (MULTIVITAMIN) tablet Take 1 tablet by mouth daily.   naproxen sodium (ALEVE) 220 MG tablet Take 220 mg by mouth as needed.   pantoprazole (PROTONIX) 40 MG tablet Take 1 tablet by mouth once daily   No facility-administered encounter medications on file as of 01/30/2022.    Allergies (verified) Prednisone   History: Past Medical History:   Diagnosis Date   Allergy    Arthritis    "some" per pt   BPH (benign prostatic hypertrophy)    Cancer (HCC)    skin- basal and squamous cell CA   Cataract    GERD (gastroesophageal reflux disease)    History of ETT    with dr. Acie Fredrickson was normal per patient   Hyperlipidemia    Hypertension    Past Surgical History:  Procedure Laterality Date   cataracts     both eyes   COLONOSCOPY     TONSILLECTOMY     Family History  Problem Relation Age of Onset   Coronary artery disease Brother        x2. One had CABG at 30. other with CABG in his 69's. Father with MI in his 39's   Esophageal cancer Father    Breast cancer Mother    Colon cancer Neg Hx    Rectal cancer Neg Hx    Stomach cancer Neg Hx    Colon polyps Neg Hx    Social History   Socioeconomic History   Marital status: Married    Spouse name: Not on file   Number of children: 2   Years of education: Not on file   Highest education level: Not on file  Occupational History   Occupation: Dealer  Tobacco Use   Smoking status: Former  Types: Cigarettes    Quit date: 01/23/1967    Years since quitting: 55.0   Smokeless tobacco: Never  Vaping Use   Vaping Use: Never used  Substance and Sexual Activity   Alcohol use: No   Drug use: No   Sexual activity: Not on file  Other Topics Concern   Not on file  Social History Narrative   Lives in [place] Determinants of Health   Financial Resource Strain: Not on file  Food Insecurity: Not on file  Transportation Needs: Not on file  Physical Activity: Not on file  Stress: Not on file  Social Connections: Not on file    Tobacco Counseling Counseling given: Not Answered   Clinical Intake:              How often do you need to have someone help you when you read instructions, pamphlets, or other written materials from your doctor or pharmacy?: (P) 1 - Never  Diabetic?No          Activities of Daily Living    01/26/2022    9:32 AM   In your present state of health, do you have any difficulty performing the following activities:  Hearing? 0  Vision? 0  Difficulty concentrating or making decisions? 0  Walking or climbing stairs? 0  Dressing or bathing? 0  Doing errands, shopping? 0  Preparing Food and eating ? N  Using the Toilet? N  In the past six months, have you accidently leaked urine? N  Do you have problems with loss of bowel control? N  Managing your Medications? N  Managing your Finances? N  Housekeeping or managing your Housekeeping? N    Patient Care Team: Susy Frizzle, MD as PCP - General (Family Medicine)  Indicate any recent Medical Services you may have received from other than [facility] providers in the past year (date may be approximate).     Assessment:   This is a routine wellness examination for Michael Nguyen.  Hearing/Vision screen No results found.  Dietary issues and exercise activities discussed:     Goals Addressed   None    Depression Screen    01/09/2021    2:58 PM 01/07/2020   11:05 AM 01/07/2020   11:04 AM 11/18/2018    9:25 AM 11/08/2017   10:47 AM 10/08/2016   11:05 AM 10/08/2016   10:36 AM  PHQ 2/9 Scores  PHQ - 2 Score 0 0 0 0 0 0 0  PHQ- 9 Score       0    Fall Risk    01/26/2022    9:32 AM 01/09/2021    2:57 PM 01/07/2020   11:04 AM 11/18/2018    9:25 AM 11/08/2017   10:47 AM  Fall Risk   Falls in the past year? 0 0 1 0 No  Number falls in past yr: 0 0 0    Injury with Fall? 0 0 0    Follow up    Falls evaluation completed     FALL RISK PREVENTION PERTAINING TO THE HOME:  Any stairs in or around the home? {YES/NO:21197} If so, are there any without handrails? {YES/NO:21197} Home free of loose throw rugs in walkways, pet beds, electrical cords, etc? {YES/NO:21197} Adequate lighting in your home to reduce risk of falls? {YES/NO:21197}  ASSISTIVE DEVICES UTILIZED TO PREVENT FALLS:  Life alert? {YES/NO:21197} Use of a cane, walker or w/c?  {YES/NO:21197} Grab bars in the bathroom? {YES/NO:21197} Shower chair or bench  in shower? {YES/NO:21197} Elevated toilet seat or a handicapped toilet? {YES/NO:21197}  TIMED UP AND GO:  Was the test performed? {YES/NO:21197}.  Length of time to ambulate 10 feet: *** sec.   {Appearance of GURK:2706237}  Cognitive Function:        01/07/2020   11:05 AM  6CIT Screen  What Year? 0 points  What month? 0 points  What time? 0 points  Count back from 20 0 points  Months in reverse 0 points  Repeat phrase 0 points  Total Score 0 points    Immunizations Immunization History  Administered Date(s) Administered   Fluad Quad(high Dose 65+) 10/24/2018, 11/05/2019, 10/18/2020   Influenza, High Dose Seasonal PF 11/01/2017   Influenza,inj,Quad PF,6+ Mos 10/15/2012, 10/20/2013, 09/28/2014, 10/08/2016   Pfizer Covid-19 Vaccine Bivalent Booster 46yr & up 11/14/2020   Pneumococcal Conjugate-13 09/14/2013   Pneumococcal Polysaccharide-23 11/06/2005   Td 10/03/2004   Zoster Recombinat (Shingrix) 02/15/2021   Zoster, Live 12/03/2006    {TDAP status:2101805}  {Flu Vaccine status:2101806}  {Pneumococcal vaccine status:2101807}  {Covid-19 vaccine status:2101808}  Qualifies for Shingles Vaccine? {YES/NO:21197}  Zostavax completed {YES/NO:21197}  {Shingrix Completed?:2101804}  Screening Tests Health Maintenance  Topic Date Due   DTaP/Tdap/Td (2 - Tdap) 10/04/2014   COLONOSCOPY (Pts 45-430yrInsurance coverage will need to be confirmed)  07/04/2020   Zoster Vaccines- Shingrix (2 of 2) 04/12/2021   INFLUENZA VACCINE  08/22/2021   COVID-19 Vaccine (2 - 2023-24 season) 09/22/2021   Medicare Annual Wellness (AWV)  01/09/2022   Pneumonia Vaccine 82Years old  Completed   HPV VACCINES  Aged Out    Health Maintenance  Health Maintenance Due  Topic Date Due   DTaP/Tdap/Td (2 - Tdap) 10/04/2014   COLONOSCOPY (Pts 45-4954yrnsurance coverage will need to be confirmed)  07/04/2020    Zoster Vaccines- Shingrix (2 of 2) 04/12/2021   INFLUENZA VACCINE  08/22/2021   COVID-19 Vaccine (2 - 2023-24 season) 09/22/2021   Medicare Annual Wellness (AWV)  01/09/2022    {Colorectal cancer screening:2101809}  Lung Cancer Screening: (Low Dose CT Chest recommended if Age 26-73-80ars, 30 pack-year currently smoking OR have quit w/in 15years.) {DOES NOT does:27190::"does not"} qualify.   Lung Cancer Screening Referral: ***  Additional Screening:  Hepatitis C Screening: {DOES NOT does:27190::"does not"} qualify; Completed ***  Vision Screening: Recommended annual ophthalmology exams for early detection of glaucoma and other disorders of the eye. Is the patient up to date with their annual eye exam?  {YES/NO:21197} Who is the provider or what is the name of the office in which the patient attends annual eye exams? *** If pt is not established with a provider, would they like to be referred to a provider to establish care? {YES/NO:21197}.   Dental Screening: Recommended annual dental exams for proper oral hygiene  Community Resource Referral / Chronic Care Management: CRR required this visit?  {YES/NO:21197}  CCM required this visit?  {YES/NO:21197}     Plan:     I have personally reviewed and noted the following in the patient's chart:   Medical and social history Use of alcohol, tobacco or illicit drugs  Current medications and supplements including opioid prescriptions. {Opioid Prescriptions:[id number]} Functional ability and status Nutritional status Physical activity Advanced directives List of other physicians Hospitalizations, surgeries, and ER visits in previous 12 months Vitals Screenings to include cognitive, depression, and falls Referrals and appointments  In addition, I have reviewed and discussed with patient certain preventive protocols, quality metrics, and best practice recommendations. A written personalized  care plan for preventive services as well  as general preventive health recommendations were provided to patient.     Denman George [place], [place]   0/03/7953   Nurse Notes: ***

## 2022-01-30 ENCOUNTER — Ambulatory Visit (INDEPENDENT_AMBULATORY_CARE_PROVIDER_SITE_OTHER): Payer: Medicare HMO

## 2022-01-30 VITALS — BP 128/74 | Ht 73.0 in | Wt 196.0 lb

## 2022-01-30 DIAGNOSIS — Z0000 Encounter for general adult medical examination without abnormal findings: Secondary | ICD-10-CM | POA: Diagnosis not present

## 2022-01-30 NOTE — Telephone Encounter (Signed)
Requested Prescriptions  Pending Prescriptions Disp Refills   losartan (COZAAR) 100 MG tablet [Pharmacy Med Name: Losartan Potassium 100 MG Oral Tablet] 45 tablet 0    Sig: Take 1/2 (one-half) tablet by mouth once daily     Cardiovascular:  Angiotensin Receptor Blockers Failed - 01/29/2022 12:50 PM      Failed - Valid encounter within last 6 months    Recent Outpatient Visits           1 year ago Routine general medical examination at a health care facility   [place], Cammie Mcgee, MD   2 years ago Routine general medical examination at a health care facility   [place], Warren T, MD   2 years ago Rib pain on left side   [place] Susy Frizzle, MD   3 years ago Routine general medical examination at a health care facility   [place], Cammie Mcgee, MD   3 years ago Chronic frontal sinusitis   [place], Cammie Mcgee, MD              Passed - Cr in normal range and within 180 days    Creat  Date Value Ref Range Status  01/24/2022 1.01 0.70 - 1.22 mg/dL Final         Passed - K in normal range and within 180 days    Potassium  Date Value Ref Range Status  01/24/2022 4.2 3.5 - 5.3 mmol/L Final         Passed - Patient is not pregnant      Passed - Last BP in normal range    BP Readings from Last 1 Encounters:  12/20/21 130/82

## 2022-01-31 LAB — CBC WITH DIFFERENTIAL/PLATELET
Absolute Monocytes: 891 cells/uL (ref 200–950)
Basophils Absolute: 50 cells/uL (ref 0–200)
Basophils Relative: 0.5 %
Eosinophils Absolute: 228 cells/uL (ref 15–500)
Eosinophils Relative: 2.3 %
HCT: 44 % (ref 38.5–50.0)
Hemoglobin: 15 g/dL (ref 13.2–17.1)
Lymphs Abs: 2148 cells/uL (ref 850–3900)
MCH: 31.6 pg (ref 27.0–33.0)
MCHC: 34.1 g/dL (ref 32.0–36.0)
MCV: 92.6 fL (ref 80.0–100.0)
MPV: 11.2 fL (ref 7.5–12.5)
Monocytes Relative: 9 %
Neutro Abs: 6584 cells/uL (ref 1500–7800)
Neutrophils Relative %: 66.5 %
Platelets: 202 10*3/uL (ref 140–400)
RBC: 4.75 10*6/uL (ref 4.20–5.80)
RDW: 11.9 % (ref 11.0–15.0)
Total Lymphocyte: 21.7 %
WBC: 9.9 10*3/uL (ref 3.8–10.8)

## 2022-01-31 LAB — TEST AUTHORIZATION

## 2022-01-31 LAB — COMPLETE METABOLIC PANEL WITH GFR
AG Ratio: 1.6 (calc) (ref 1.0–2.5)
ALT: 11 U/L (ref 9–46)
AST: 14 U/L (ref 10–35)
Albumin: 4.1 g/dL (ref 3.6–5.1)
Alkaline phosphatase (APISO): 60 U/L (ref 35–144)
BUN: 14 mg/dL (ref 7–25)
CO2: 27 mmol/L (ref 20–32)
Calcium: 9.5 mg/dL (ref 8.6–10.3)
Chloride: 107 mmol/L (ref 98–110)
Creat: 1.01 mg/dL (ref 0.70–1.22)
Globulin: 2.5 g/dL (calc) (ref 1.9–3.7)
Glucose, Bld: 96 mg/dL (ref 65–99)
Potassium: 4.2 mmol/L (ref 3.5–5.3)
Sodium: 142 mmol/L (ref 135–146)
Total Bilirubin: 0.5 mg/dL (ref 0.2–1.2)
Total Protein: 6.6 g/dL (ref 6.1–8.1)
eGFR: 75 mL/min/{1.73_m2} (ref >=60)

## 2022-01-31 LAB — PSA: PSA: 1.69 ng/mL (ref <=4.00)

## 2022-01-31 LAB — LIPID PANEL
Cholesterol: 118 mg/dL (ref <200)
HDL: 36 mg/dL — ABNORMAL LOW (ref >=40)
LDL Cholesterol (Calc): 62 mg/dL (calc)
Non-HDL Cholesterol (Calc): 82 mg/dL (calc) (ref <130)
Total CHOL/HDL Ratio: 3.3 (calc) (ref <5.0)
Triglycerides: 118 mg/dL (ref <150)

## 2022-02-01 DIAGNOSIS — R350 Frequency of micturition: Secondary | ICD-10-CM | POA: Diagnosis not present

## 2022-02-15 ENCOUNTER — Ambulatory Visit (INDEPENDENT_AMBULATORY_CARE_PROVIDER_SITE_OTHER): Payer: Medicare HMO | Admitting: Family Medicine

## 2022-02-15 ENCOUNTER — Encounter: Payer: Self-pay | Admitting: Family Medicine

## 2022-02-15 VITALS — BP 132/80 | HR 77 | Temp 98.4°F | Ht 73.0 in | Wt 195.4 lb

## 2022-02-15 DIAGNOSIS — Z125 Encounter for screening for malignant neoplasm of prostate: Secondary | ICD-10-CM

## 2022-02-15 DIAGNOSIS — S61213A Laceration without foreign body of left middle finger without damage to nail, initial encounter: Secondary | ICD-10-CM

## 2022-02-15 DIAGNOSIS — E7800 Pure hypercholesterolemia, unspecified: Secondary | ICD-10-CM

## 2022-02-15 DIAGNOSIS — S61219A Laceration without foreign body of unspecified finger without damage to nail, initial encounter: Secondary | ICD-10-CM

## 2022-02-15 DIAGNOSIS — Z0001 Encounter for general adult medical examination with abnormal findings: Secondary | ICD-10-CM | POA: Diagnosis not present

## 2022-02-15 DIAGNOSIS — Z0000 Encounter for general adult medical examination without abnormal findings: Secondary | ICD-10-CM

## 2022-02-15 DIAGNOSIS — Z23 Encounter for immunization: Secondary | ICD-10-CM

## 2022-02-15 DIAGNOSIS — I10 Essential (primary) hypertension: Secondary | ICD-10-CM | POA: Diagnosis not present

## 2022-02-15 MED ORDER — LOSARTAN POTASSIUM 50 MG PO TABS: 50.0000 mg | ORAL_TABLET | Freq: Every day | ORAL | 3 refills | Status: DC

## 2022-02-15 NOTE — Progress Notes (Signed)
Subjective:    Patient ID: Michael Nguyen, male    DOB: 24-Oct-1940, 82 y.o.   MRN: [id number]  HPI  Patient is a very pleasant 82 year old Caucasian gentleman here today for complete physical exam.  In November he was seen in the urgent care for right upper quadrant abdominal pain.  They did an abdominal x-ray.  At that time liver function tests were slightly elevated.  He states the pain went away after a few days and he has been fine ever since.  Today he denies any abdominal pain in the right upper quadrant.  He denies any pain with eating.  He denies any nausea or vomiting.  I suspect that he may have had a gallstone however he is asymptomatic at present.  He has had his flu shot.  Prevnar 13 and Pneumovax 23 up-to-date.  He had Shingrix.  He has had a COVID booster.  He is due for RSV.  Yesterday he suffered a laceration to his knuckles while working on a rusty car trying to install a motor.  The area where he was working was very [place], water.  His last tetanus shot is more than 82 years old.  Due to age she does not require colon cancer screening.  He denies any falls or depression or memory loss. Lab on 01/24/2022  Component Date Value Ref Range Status   Cholesterol 01/24/2022 118  <200 mg/dL Final   HDL 01/24/2022 36 (L)  > OR = 40 mg/dL Final   Triglycerides 01/24/2022 118  <150 mg/dL Final   LDL Cholesterol (Calc) 01/24/2022 62  mg/dL (calc) Final   Comment: Reference range: <100 . Desirable range <100 mg/dL for primary prevention;   <70 mg/dL for patients with CHD or diabetic patients  with > or = 2 CHD risk factors. Marland Kitchen LDL-C is now calculated using the Martin-Hopkins  calculation, which is a validated novel method providing  better accuracy than the Friedewald equation in the  estimation of LDL-C.  Cresenciano Genre et al. Annamaria Helling. 6222;979(89): 2061-2068  (http://education.[facility].com/faq/FAQ164)    Total CHOL/HDL Ratio 01/24/2022 3.3  <5.0 (calc) Final   Non-HDL Cholesterol  (Calc) 01/24/2022 82  <130 mg/dL (calc) Final   Comment: For patients with diabetes plus 1 major ASCVD risk  factor, treating to a non-HDL-C goal of <100 mg/dL  (LDL-C of <70 mg/dL) is considered a therapeutic  option.    WBC 01/24/2022 9.9  3.8 - 10.8 Thousand/uL Final   RBC 01/24/2022 4.75  4.20 - 5.80 Million/uL Final   Hemoglobin 01/24/2022 15.0  13.2 - 17.1 g/dL Final   HCT 01/24/2022 44.0  38.5 - 50.0 % Final   MCV 01/24/2022 92.6  80.0 - 100.0 fL Final   MCH 01/24/2022 31.6  27.0 - 33.0 pg Final   MCHC 01/24/2022 34.1  32.0 - 36.0 g/dL Final   RDW 01/24/2022 11.9  11.0 - 15.0 % Final   Platelets 01/24/2022 202  140 - 400 Thousand/uL Final   MPV 01/24/2022 11.2  7.5 - 12.5 fL Final   Neutro Abs 01/24/2022 6,584  1,500 - 7,800 cells/uL Final   Lymphs Abs 01/24/2022 2,148  850 - 3,900 cells/uL Final   Absolute Monocytes 01/24/2022 891  200 - 950 cells/uL Final   Eosinophils Absolute 01/24/2022 228  15 - 500 cells/uL Final   Basophils Absolute 01/24/2022 50  0 - 200 cells/uL Final   Neutrophils Relative % 01/24/2022 66.5  % Final   Total Lymphocyte 01/24/2022 21.7  % Final  Monocytes Relative 01/24/2022 9.0  % Final   Eosinophils Relative 01/24/2022 2.3  % Final   Basophils Relative 01/24/2022 0.5  % Final   Glucose, Bld 01/24/2022 96  65 - 99 mg/dL Final   Comment: .            Fasting reference interval .    BUN 01/24/2022 14  7 - 25 mg/dL Final   Creat 01/24/2022 1.01  0.70 - 1.22 mg/dL Final   eGFR 01/24/2022 75  > OR = 60 mL/min/1.37m Final   BUN/Creatinine Ratio 01/24/2022 SEE NOTE:  6 - 22 (calc) Final   Comment:    Not Reported: BUN and Creatinine are within    reference range. .    Sodium 01/24/2022 142  135 - 146 mmol/L Final   Potassium 01/24/2022 4.2  3.5 - 5.3 mmol/L Final   Chloride 01/24/2022 107  98 - 110 mmol/L Final   CO2 01/24/2022 27  20 - 32 mmol/L Final   Calcium 01/24/2022 9.5  8.6 - 10.3 mg/dL Final   Total Protein 01/24/2022 6.6  6.1 - 8.1  g/dL Final   Albumin 01/24/2022 4.1  3.6 - 5.1 g/dL Final   Globulin 01/24/2022 2.5  1.9 - 3.7 g/dL (calc) Final   AG Ratio 01/24/2022 1.6  1.0 - 2.5 (calc) Final   Total Bilirubin 01/24/2022 0.5  0.2 - 1.2 mg/dL Final   Alkaline phosphatase (APISO) 01/24/2022 60  35 - 144 U/L Final   AST 01/24/2022 14  10 - 35 U/L Final   ALT 01/24/2022 11  9 - 46 U/L Final   TEST NAME: 01/24/2022 PSA, TOTAL   Final   TEST CODE: 01/24/2022 [id number]   Final   CLIENT CONTACT: 01/24/2022 KLearta Codding  Final   REPORT ALWAYS MESSAGE SIGNATURE 01/24/2022    Final   Comment: . The laboratory testing on this patient was verbally requested or confirmed by the ordering physician or his or her authorized representative after contact with an employee of [employer] Federal regulations require that we maintain on file written authorization for all laboratory testing.  Accordingly we are asking that the ordering physician or his or her authorized representative sign a copy of this report and promptly return it to the client service representative. . . Signature:____________________________________________________ . Please fax this signed page to [phone] return it via your [employer].    PSA 01/24/2022 1.69  < OR = 4.00 ng/mL Final   Comment: The total PSA value from this assay system is  standardized against the WHO standard. The test  result will be approximately 20% lower when compared  to the equimolar-standardized total PSA (Beckman  Coulter). Comparison of serial PSA results should be  interpreted with this fact in mind. . This test was performed using the Siemens  chemiluminescent method. Values obtained from  different assay methods cannot be used interchangeably. PSA levels, regardless of value, should not be interpreted as absolute evidence of the presence or absence of disease.     Subjective:    Review Past Medical/Family/Social: Past Medical History:   Diagnosis Date   Allergy    Arthritis    "some" per pt   BPH (benign prostatic hypertrophy)    Cancer (HCC)    skin- basal and squamous cell CA   Cataract    GERD (gastroesophageal reflux disease)    History of ETT    with dr. NAcie Fredricksonwas normal per patient   Hyperlipidemia    Hypertension  Past Surgical History:  Procedure Laterality Date   cataracts     both eyes   COLONOSCOPY     TONSILLECTOMY     Current Outpatient Medications on File Prior to Visit  Medication Sig Dispense Refill   ALOE VERA PO Take by mouth daily.     aspirin 81 MG tablet Take 81 mg by mouth daily.     atorvastatin (LIPITOR) 40 MG tablet Take 1 tablet by mouth once daily 90 tablet 0   Cholecalciferol (VITAMIN D3) 1000 UNITS CAPS Take 1 capsule by mouth 2 (two) times daily.     fexofenadine (ALLEGRA) 180 MG tablet Take 180 mg by mouth daily.     fish oil-omega-3 fatty acids 1000 MG capsule Take 1 g by mouth 2 (two) times daily.     fluticasone (FLONASE) 50 MCG/ACT nasal spray Place 2 sprays into both nostrils daily.     GLUCOSAMINE-CHONDROITIN-VIT C PO Take 2,000 mg by mouth daily.     montelukast (SINGULAIR) 10 MG tablet Take 1 tablet (10 mg total) by mouth at bedtime. OFFICE VISIT NEEDED FOR ADDITIONAL REFILLS 90 tablet 0   Multiple Vitamin (MULTIVITAMIN) tablet Take 1 tablet by mouth daily.     naproxen sodium (ALEVE) 220 MG tablet Take 220 mg by mouth as needed.     pantoprazole (PROTONIX) 40 MG tablet Take 1 tablet by mouth once daily 90 tablet 0   No current facility-administered medications on file prior to visit.   Allergies  Allergen Reactions   Prednisone     REACTION: ? intolerance per patient / Cramps   Social History   Socioeconomic History   Marital status: Married    Spouse name: Not on file   Number of children: 2   Years of education: Not on file   Highest education level: Not on file  Occupational History   Occupation: Dealer  Tobacco Use   Smoking status: Former     Types: Cigarettes    Quit date: 01/23/1967    Years since quitting: 55.1   Smokeless tobacco: Never  Vaping Use   Vaping Use: Never used  Substance and Sexual Activity   Alcohol use: No   Drug use: No   Sexual activity: Not on file  Other Topics Concern   Not on file  Social History Narrative   Lives in [place] Strain: [place]  (01/30/2022)   Overall Financial Resource Strain (CARDIA)    Difficulty of Paying Living Expenses: Not hard at all  Food Insecurity: No [place] (01/30/2022)   Hunger Vital Sign    Worried About Running Out of Food in the Last Year: Never true    [place] in the Last Year: Never true  Transportation Needs: No Transportation Needs (01/30/2022)   PRAPARE - Hydrologist (Medical): No    Lack of Transportation (Non-Medical): No  Physical Activity: Inactive (01/30/2022)   Exercise Vital Sign    Days of Exercise per Week: 0 days    Minutes of Exercise per Session: 0 min  Stress: No Stress Concern Present (01/30/2022)   [place]    Feeling of Stress : Not at all  Social Connections: [place] (01/30/2022)   Social Connection and Isolation Panel [NHANES]    Frequency of Communication with Friends and Family: More than three times a week    Frequency of Social  Gatherings with Friends and Family: Three times a week    Attends Religious Services: More than 4 times per year    Active Member of Clubs or Organizations: Yes    Attends Archivist Meetings: More than 4 times per year    Marital Status: Married  Human resources officer Violence: Not At Risk (01/30/2022)   Humiliation, Afraid, Rape, and Kick questionnaire    Fear of Current or Ex-Partner: No    Emotionally Abused: No    Physically Abused: No    Sexually Abused: No   Family History  Problem Relation Age of Onset   Coronary artery  disease Brother        x2. One had CABG at 10. other with CABG in his 83's. Father with MI in his 3's   Esophageal cancer Father    Breast cancer Mother    Colon cancer Neg Hx    Rectal cancer Neg Hx    Stomach cancer Neg Hx    Colon polyps Neg Hx       Review of Systems  All other systems reviewed and are negative.      Objective:   Physical Exam Vitals reviewed.  Constitutional:      General: He is not in acute distress.    Appearance: He is well-developed. He is not diaphoretic.  HENT:     Head: Normocephalic and atraumatic.     Right Ear: External ear normal.     Left Ear: External ear normal.     Nose: Nose normal.     Mouth/Throat:     Pharynx: No oropharyngeal exudate.  Eyes:     General: No scleral icterus.       Right eye: No discharge.        Left eye: No discharge.     Conjunctiva/sclera: Conjunctivae normal.     Pupils: Pupils are equal, round, and reactive to light.  Neck:     Thyroid: No thyromegaly.     Vascular: No JVD.     Trachea: No tracheal deviation.  Cardiovascular:     Rate and Rhythm: Normal rate and regular rhythm.     Heart sounds: Normal heart sounds. No murmur heard.    No friction rub. No gallop.  Pulmonary:     Effort: Pulmonary effort is normal. No respiratory distress.     Breath sounds: Normal breath sounds. No stridor. No wheezing or rales.  Chest:     Chest wall: No tenderness.  Abdominal:     General: Bowel sounds are normal. There is no distension.     Palpations: Abdomen is soft. There is no mass.     Tenderness: There is no abdominal tenderness. There is no guarding or rebound.  Genitourinary:    Prostate: Not enlarged and not tender.     Rectum: Normal.  Musculoskeletal:        General: No tenderness. Normal range of motion.     Cervical back: Normal range of motion and neck supple.  Lymphadenopathy:     Cervical: No cervical adenopathy.  Skin:    General: Skin is warm.     Coloration: Skin is not pale.      Findings: No erythema or rash.  Neurological:     Mental Status: He is alert and oriented to person, place, and time.     Cranial Nerves: No cranial nerve deficit.     Motor: No abnormal muscle tone.     Coordination: Coordination normal.  Deep Tendon Reflexes: Reflexes are normal and symmetric.  Psychiatric:        Behavior: Behavior normal.        Thought Content: Thought content normal.        Judgment: Judgment normal.   The skin over the left second, third, and fourth PIP joint is lacerated.  This is from working on a rusty car yesterday.  The area is scabbed over and no longer bleeding        Assessment & Plan:  Encounter for Medicare annual wellness exam  Benign essential HTN  Prostate cancer screening  Pure hypercholesterolemia  Cut of finger Gave patient a shot today his tetanus shot is more than 82 years old and he suffered a laceration yesterday that was contaminated.  Blood pressure is excellent.  Lab work is outstanding.  Cancer screening is up-to-date.  Recommended RSV vaccine.  The remainder of his vaccinations are up-to-date.  Regular anticipatory guidance is provided.

## 2022-02-15 NOTE — Addendum Note (Signed)
Addended by: Randal Buba K on: 02/15/2022 12:15 PM   Modules accepted: Orders

## 2022-02-28 ENCOUNTER — Other Ambulatory Visit: Payer: Self-pay | Admitting: Family Medicine

## 2022-03-01 ENCOUNTER — Other Ambulatory Visit: Payer: Self-pay | Admitting: Family Medicine

## 2022-03-01 NOTE — Telephone Encounter (Signed)
Requested Prescriptions  Pending Prescriptions Disp Refills   montelukast (SINGULAIR) 10 MG tablet [Pharmacy Med Name: Montelukast Sodium 10 MG Oral Tablet] 90 tablet 0    Sig: TAKE 1 TABLET BY MOUTH ONCE DAILY AT BEDTIME . APPOINTMENT REQUIRED FOR FUTURE REFILLS     Pulmonology:  Leukotriene Inhibitors Failed - 02/28/2022 10:48 PM      Failed - Valid encounter within last 12 months    Recent Outpatient Visits           1 year ago Routine general medical examination at a health care facility   [place], Cammie Mcgee, MD   2 years ago Routine general medical examination at a health care facility   [place], Warren T, MD   2 years ago Rib pain on left side   [place] [facility] Schaumann, Cammie Mcgee, MD   3 years ago Routine general medical examination at a health care facility   [place], Cammie Mcgee, MD   3 years ago Chronic frontal sinusitis   [place] Pickard, Cammie Mcgee, MD

## 2022-03-01 NOTE — Telephone Encounter (Signed)
Requested Prescriptions  Pending Prescriptions Disp Refills   pantoprazole (PROTONIX) 40 MG tablet [Pharmacy Med Name: Pantoprazole Sodium 40 MG Oral Tablet Delayed Release] 90 tablet 0    Sig: Take 1 tablet by mouth once daily     Gastroenterology: Proton Pump Inhibitors Failed - 03/01/2022  7:36 AM      Failed - Valid encounter within last 12 months    Recent Outpatient Visits           1 year ago Routine general medical examination at a health care facility   [place], Cammie Mcgee, MD   2 years ago Routine general medical examination at a health care facility   [place], Warren T, MD   2 years ago Rib pain on left side   [place] [facility] Schaumann, Cammie Mcgee, MD   3 years ago Routine general medical examination at a health care facility   [place], Cammie Mcgee, MD   3 years ago Chronic frontal sinusitis   [place] Pickard, Cammie Mcgee, MD

## 2022-03-15 DIAGNOSIS — R350 Frequency of micturition: Secondary | ICD-10-CM | POA: Diagnosis not present

## 2022-03-19 ENCOUNTER — Other Ambulatory Visit: Payer: Self-pay | Admitting: Family Medicine

## 2022-04-19 DIAGNOSIS — R350 Frequency of micturition: Secondary | ICD-10-CM | POA: Diagnosis not present

## 2022-04-25 DIAGNOSIS — D2271 Melanocytic nevi of right lower limb, including hip: Secondary | ICD-10-CM | POA: Diagnosis not present

## 2022-04-25 DIAGNOSIS — L570 Actinic keratosis: Secondary | ICD-10-CM | POA: Diagnosis not present

## 2022-04-25 DIAGNOSIS — D225 Melanocytic nevi of trunk: Secondary | ICD-10-CM | POA: Diagnosis not present

## 2022-04-25 DIAGNOSIS — D2272 Melanocytic nevi of left lower limb, including hip: Secondary | ICD-10-CM | POA: Diagnosis not present

## 2022-04-25 DIAGNOSIS — D485 Neoplasm of uncertain behavior of skin: Secondary | ICD-10-CM | POA: Diagnosis not present

## 2022-04-25 DIAGNOSIS — L821 Other seborrheic keratosis: Secondary | ICD-10-CM | POA: Diagnosis not present

## 2022-04-25 DIAGNOSIS — L814 Other melanin hyperpigmentation: Secondary | ICD-10-CM | POA: Diagnosis not present

## 2022-04-25 DIAGNOSIS — Z85828 Personal history of other malignant neoplasm of skin: Secondary | ICD-10-CM | POA: Diagnosis not present

## 2022-05-24 ENCOUNTER — Other Ambulatory Visit: Payer: Self-pay | Admitting: Family Medicine

## 2022-05-24 DIAGNOSIS — R350 Frequency of micturition: Secondary | ICD-10-CM | POA: Diagnosis not present

## 2022-05-27 ENCOUNTER — Other Ambulatory Visit: Payer: Self-pay | Admitting: Family Medicine

## 2022-05-28 NOTE — Telephone Encounter (Signed)
Requested Prescriptions  Pending Prescriptions Disp Refills   pantoprazole (PROTONIX) 40 MG tablet [Pharmacy Med Name: Pantoprazole Sodium 40 MG Oral Tablet Delayed Release] 90 tablet 1    Sig: Take 1 tablet by mouth once daily     Gastroenterology: Proton Pump Inhibitors Failed - 05/27/2022  1:18 PM      Failed - Valid encounter within last 12 months    Recent Outpatient Visits           1 year ago Routine general medical examination at a health care facility   [facility] Pick[place], Priscille Heidelberg, MD   2 years ago Routine general medical examination at a health care facility   [facility] Donita Brooks, MD   2 years ago Rib pain on left side   [facility] Tanya Nones, Priscille Heidelberg, MD   3 years ago Routine general medical examination at a health care facility   [facility] Pickard, Priscille Heidelberg, MD   4 years ago Chronic frontal sinusitis   [facility] Pickard, Priscille Heidelberg, MD

## 2022-06-14 ENCOUNTER — Other Ambulatory Visit: Payer: Self-pay | Admitting: Family Medicine

## 2022-06-14 NOTE — Telephone Encounter (Signed)
Due to a computer glitch the protocol does not properly indicate last office visit.   He was seen on 02/15/2022 by Dr. Tanya Nones to refill given.  Labs in date.  Requested Prescriptions  Pending Prescriptions Disp Refills   atorvastatin (LIPITOR) 40 MG tablet [Pharmacy Med Name: Atorvastatin Calcium 40 MG Oral Tablet] 90 tablet 1    Sig: Take 1 tablet by mouth once daily     Cardiovascular:  Antilipid - Statins Failed - 06/14/2022  9:23 AM      Failed - Valid encounter within last 12 months    Recent Outpatient Visits           1 year ago Routine general medical examination at a health care facility   [facility] Pickard, Priscille Heidelberg, MD   2 years ago Routine general medical examination at a health care facility   [facility] Donita Brooks, MD   2 years ago Rib pain on left side   [facility] Tanya Nones, Priscille Heidelberg, MD   3 years ago Routine general medical examination at a health care facility   [facility] Pickard, Priscille Heidelberg, MD   4 years ago Chronic frontal sinusitis   [facility] Tanya Nones, Priscille Heidelberg, MD              Failed - Lipid Panel in normal range within the last 12 months    Cholesterol  Date Value Ref Range Status  01/24/2022 118 <200 mg/dL Final   LDL Cholesterol (Calc)  Date Value Ref Range Status  01/24/2022 62 mg/dL (calc) Final    Comment:    Reference range: <100 . Desirable range <100 mg/dL for primary prevention;   <70 mg/dL for patients with CHD or diabetic patients  with > or = 2 CHD risk factors. Marland Kitchen LDL-C is now calculated using the Martin-Hopkins  calculation, which is a validated novel method providing  better accuracy than the Friedewald equation in the  estimation of LDL-C.  Horald Pollen et al. Lenox Ahr. 1610;960(45): 2061-2068  (http://education.[facility].com/faq/FAQ164)    HDL  Date Value Ref Range Status  01/24/2022 36 (L) > OR = 40 mg/dL Final   Triglycerides   Date Value Ref Range Status  01/24/2022 118 <150 mg/dL Final         Passed - Patient is not pregnant

## 2022-06-28 DIAGNOSIS — R350 Frequency of micturition: Secondary | ICD-10-CM | POA: Diagnosis not present

## 2022-07-20 ENCOUNTER — Other Ambulatory Visit: Payer: Self-pay | Admitting: Family Medicine

## 2022-07-23 NOTE — Telephone Encounter (Signed)
Requested medication (s) are due for refill today: yes   Requested medication (s) are on the active medication list: yes   Last refill:  05/24/22 #60 0 refills  Future visit scheduled: no   Notes to clinic:  last OV wellness visit 02/15/22. No refills remain. Do you want to refill Rx?     Requested Prescriptions  Pending Prescriptions Disp Refills   montelukast (SINGULAIR) 10 MG tablet [Pharmacy Med Name: Montelukast Sodium 10 MG Oral Tablet] 60 tablet 0    Sig: TAKE 1 TABLET BY MOUTH AT BEDTIME. APPOINTMENT REQUIRED FOR FUTURE REFILLS.     Pulmonology:  Leukotriene Inhibitors Failed - 07/23/2022 12:19 PM      Failed - Valid encounter within last 12 months    Recent Outpatient Visits           1 year ago Routine general medical examination at a health care facility   [facility] Pick[place], Priscille Heidelberg, MD   2 years ago Routine general medical examination at a health care facility   [facility] Donita Brooks, MD   2 years ago Rib pain on left side   [facility] Tanya Nones, Priscille Heidelberg, MD   3 years ago Routine general medical examination at a health care facility   [facility] Pickard, Priscille Heidelberg, MD   4 years ago Chronic frontal sinusitis   [facility] Pickard, Priscille Heidelberg, MD

## 2022-08-01 DIAGNOSIS — R351 Nocturia: Secondary | ICD-10-CM | POA: Diagnosis not present

## 2022-08-01 DIAGNOSIS — R350 Frequency of micturition: Secondary | ICD-10-CM | POA: Diagnosis not present

## 2022-08-02 DIAGNOSIS — R350 Frequency of micturition: Secondary | ICD-10-CM | POA: Diagnosis not present

## 2022-08-03 ENCOUNTER — Ambulatory Visit (INDEPENDENT_AMBULATORY_CARE_PROVIDER_SITE_OTHER): Payer: Medicare HMO | Admitting: Family Medicine

## 2022-08-03 ENCOUNTER — Encounter: Payer: Self-pay | Admitting: Family Medicine

## 2022-08-03 VITALS — BP 126/72 | HR 67 | Temp 97.5°F | Ht 73.0 in | Wt 197.0 lb

## 2022-08-03 DIAGNOSIS — I10 Essential (primary) hypertension: Secondary | ICD-10-CM

## 2022-08-03 LAB — CBC WITH DIFFERENTIAL/PLATELET
Absolute Monocytes: 656 cells/uL (ref 200–950)
Eosinophils Absolute: 149 cells/uL (ref 15–500)
Eosinophils Relative: 1.8 %
MCHC: 33.7 g/dL (ref 32.0–36.0)
Neutrophils Relative %: 63.2 %
Platelets: 208 10*3/uL (ref 140–400)
RDW: 12.3 % (ref 11.0–15.0)

## 2022-08-03 NOTE — Progress Notes (Signed)
Subjective:    Patient ID: Michael Nguyen, male    DOB: 07-29-1940, 82 y.o.   MRN: [id number]  HPI Patient is a very pleasant 82 year old Caucasian gentleman who has a history of hypertension as well as hyperlipidemia.  We checked his cholesterol in January it was outstanding.  Recently he has been dealing with bilateral hip pain.  The pain located greater trochanter bilaterally.  Patient states he was having great deal of pain trying to get on and off his lawnmower.  He was taking ibuprofen on a daily basis and now the pain in his hip has improved.  However the pain was definitely centered over the greater trochanter.  He denies any anterior hip pain or posterior hip pain.  He is due to recheck his renal function given his chronic use of NSAIDs.  His blood pressure today is outstanding at 126/72.  He does report having to clear his throat frequently.  Will try the patient on Singulair in addition to his antihistamine and Flonase but this did not help.  He has a history of gastroesophageal reflux disease for which she takes pantoprazole but he denies any heartburn Past Medical History:  Diagnosis Date   Allergy    Arthritis    "some" per pt   BPH (benign prostatic hypertrophy)    Cancer (HCC)    skin- basal and squamous cell CA   Cataract    GERD (gastroesophageal reflux disease)    History of ETT    with dr. Elease Hashimoto was normal per patient   Hyperlipidemia    Hypertension    Past Surgical History:  Procedure Laterality Date   cataracts     both eyes   COLONOSCOPY     TONSILLECTOMY     Current Outpatient Medications on File Prior to Visit  Medication Sig Dispense Refill   ALOE VERA PO Take by mouth daily.     aspirin 81 MG tablet Take 81 mg by mouth daily.     atorvastatin (LIPITOR) 40 MG tablet Take 1 tablet by mouth once daily 90 tablet 1   Cholecalciferol (VITAMIN D3) 1000 UNITS CAPS Take 1 capsule by mouth 2 (two) times daily.     fexofenadine (ALLEGRA) 180 MG tablet Take 180 mg  by mouth daily.     fish oil-omega-3 fatty acids 1000 MG capsule Take 1 g by mouth 2 (two) times daily.     fluticasone (FLONASE) 50 MCG/ACT nasal spray Place 2 sprays into both nostrils daily.     GLUCOSAMINE-CHONDROITIN-VIT C PO Take 2,000 mg by mouth daily.     losartan (COZAAR) 50 MG tablet Take 1 tablet (50 mg total) by mouth daily. 90 tablet 3   montelukast (SINGULAIR) 10 MG tablet TAKE 1 TABLET BY MOUTH AT BEDTIME. APPOINTMENT REQUIRED FOR FUTURE REFILLS. 60 tablet 0   Multiple Vitamin (MULTIVITAMIN) tablet Take 1 tablet by mouth daily.     naproxen sodium (ALEVE) 220 MG tablet Take 220 mg by mouth as needed.     pantoprazole (PROTONIX) 40 MG tablet Take 1 tablet by mouth once daily 90 tablet 1   No current facility-administered medications on file prior to visit.   Allergies  Allergen Reactions   Prednisone     REACTION: ? intolerance per patient / Cramps   Social History   Socioeconomic History   Marital status: Married    Spouse name: Not on file   Number of children: 2   Years of education: Not on file   Highest  education level: GED or equivalent  Occupational History   Occupation: Curator  Tobacco Use   Smoking status: Former    Current packs/day: 0.00    Types: Cigarettes    Quit date: 01/23/1967    Years since quitting: 55.5   Smokeless tobacco: Never  Vaping Use   Vaping status: Never Used  Substance and Sexual Activity   Alcohol use: No   Drug use: No   Sexual activity: Not on file  Other Topics Concern   Not on file  Social History Narrative   Lives in [place]   Social Determinants of Health   Financial Resource Strain: Low Risk  (07/30/2022)   Overall Financial Resource Strain (CARDIA)    Difficulty of Paying Living Expenses: Not hard at all  Food Insecurity: No Food Insecurity (07/30/2022)   Hunger Vital Sign    Worried About Running Out of Food in the Last Year: Never true    Ran Out of Food in the Last Year: Never true  Transportation Needs:  No Transportation Needs (07/30/2022)   PRAPARE - Administrator, Civil Service (Medical): No    Lack of Transportation (Non-Medical): No  Physical Activity: Inactive (07/30/2022)   Exercise Vital Sign    Days of Exercise per Week: 0 days    Minutes of Exercise per Session: 0 min  Stress: No Stress Concern Present (07/30/2022)   [employer] of Occupational Health - Occupational Stress Questionnaire    Feeling of Stress : Not at all  Social Connections: Socially Integrated (07/30/2022)   Social Connection and Isolation Panel [NHANES]    Frequency of Communication with Friends and Family: Twice a week    Frequency of Social Gatherings with Friends and Family: Once a week    Attends Religious Services: More than 4 times per year    Active Member of [employer] or Organizations: Yes    Attends Engineer, structural: More than 4 times per year    Marital Status: Married  Catering manager Violence: Not At Risk (01/30/2022)   Humiliation, Afraid, Rape, and Kick questionnaire    Fear of Current or Ex-Partner: No    Emotionally Abused: No    Physically Abused: No    Sexually Abused: No      Review of Systems  HENT:  Negative for nosebleeds, postnasal drip, rhinorrhea, sinus pressure, sinus pain, sore throat and trouble swallowing.   Cardiovascular:  Negative for chest pain, palpitations and leg swelling.  Gastrointestinal:  Negative for nausea and vomiting.  All other systems reviewed and are negative.      Objective:   Physical Exam Constitutional:      General: He is not in acute distress.    Appearance: He is well-developed. He is not diaphoretic.  HENT:     Head: Normocephalic and atraumatic.     Right Ear: External ear normal.     Left Ear: External ear normal.     Mouth/Throat:     Pharynx: No oropharyngeal exudate.  Eyes:     Conjunctiva/sclera: Conjunctivae normal.  Neck:     Thyroid: No thyromegaly.     Vascular: No JVD.  Cardiovascular:     Rate and  Rhythm: Normal rate and regular rhythm.     Pulses:          Carotid pulses are 2+ on the right side and 2+ on the left side.    Heart sounds: Normal heart sounds. No murmur heard.    No friction rub.  No gallop.  Pulmonary:     Effort: Pulmonary effort is normal. No respiratory distress.     Breath sounds: No decreased breath sounds, wheezing, rhonchi or rales.  Chest:     Chest wall: No tenderness or crepitus.  Abdominal:     General: Bowel sounds are normal. There is no distension.     Palpations: Abdomen is soft.     Tenderness: There is no abdominal tenderness. There is no guarding or rebound.  Musculoskeletal:     Cervical back: Neck supple.  Lymphadenopathy:     Cervical: No cervical adenopathy.  Neurological:     Mental Status: He is alert and oriented to person, place, and time.     Cranial Nerves: No cranial nerve deficit.     Motor: No abnormal muscle tone.     Coordination: Coordination normal.     Deep Tendon Reflexes: Reflexes are normal and symmetric.        Assessment & Plan:  Benign essential HTN - Plan: CBC with Differential/Platelet, COMPLETE METABOLIC PANEL WITH GFR Patient's blood pressure today is excellent.  I would like to check his renal function and his potassium due to his recent use of NSAIDs.  Recommended monitoring his renal function every 6 months if he needs to take NSAIDs on a daily basis.  He denies any GI upset.  However I do believe that the "clearing his throat" may potentially be due to reflux.  I recommended elevating the head of his bed 2 inches to see if this will help with silent reflux at night and clear up the drainage/mucus in his throat

## 2022-08-04 LAB — COMPLETE METABOLIC PANEL WITH GFR
AG Ratio: 1.8 (calc) (ref 1.0–2.5)
ALT: 22 U/L (ref 9–46)
AST: 21 U/L (ref 10–35)
Albumin: 4.4 g/dL (ref 3.6–5.1)
Alkaline phosphatase (APISO): 54 U/L (ref 35–144)
BUN: 19 mg/dL (ref 7–25)
CO2: 25 mmol/L (ref 20–32)
Calcium: 9.3 mg/dL (ref 8.6–10.3)
Chloride: 106 mmol/L (ref 98–110)
Creat: 0.96 mg/dL (ref 0.70–1.22)
Globulin: 2.4 g/dL (calc) (ref 1.9–3.7)
Glucose, Bld: 85 mg/dL (ref 65–99)
Potassium: 4.2 mmol/L (ref 3.5–5.3)
Sodium: 139 mmol/L (ref 135–146)
Total Bilirubin: 0.7 mg/dL (ref 0.2–1.2)
Total Protein: 6.8 g/dL (ref 6.1–8.1)
eGFR: 79 mL/min/{1.73_m2} (ref >=60)

## 2022-08-04 LAB — CBC WITH DIFFERENTIAL/PLATELET
Basophils Absolute: 42 cells/uL (ref 0–200)
Basophils Relative: 0.5 %
HCT: 43.3 % (ref 38.5–50.0)
Hemoglobin: 14.6 g/dL (ref 13.2–17.1)
Lymphs Abs: 2208 cells/uL (ref 850–3900)
MCH: 31.5 pg (ref 27.0–33.0)
MCV: 93.5 fL (ref 80.0–100.0)
MPV: 11.7 fL (ref 7.5–12.5)
Monocytes Relative: 7.9 %
Neutro Abs: 5246 cells/uL (ref 1500–7800)
RBC: 4.63 10*6/uL (ref 4.20–5.80)
Total Lymphocyte: 26.6 %
WBC: 8.3 10*3/uL (ref 3.8–10.8)

## 2022-09-06 DIAGNOSIS — R350 Frequency of micturition: Secondary | ICD-10-CM | POA: Diagnosis not present

## 2022-09-13 ENCOUNTER — Other Ambulatory Visit: Payer: Self-pay | Admitting: Family Medicine

## 2022-09-13 NOTE — Telephone Encounter (Signed)
Last OV 08/03/22 Requested Prescriptions  Pending Prescriptions Disp Refills   montelukast (SINGULAIR) 10 MG tablet [Pharmacy Med Name: Montelukast Sodium 10 MG Oral Tablet] 60 tablet 0    Sig: TAKE 1 TABLET BY MOUTH AT BEDTIME.(APPOINTMENT REQUIRED FOR FUTURE REFILLS.)     Pulmonology:  Leukotriene Inhibitors Failed - 09/13/2022  6:47 AM      Failed - Valid encounter within last 12 months    Recent Outpatient Visits           1 year ago Routine general medical examination at a health care facility   [facility] Pickard, Priscille Heidelberg, MD   2 years ago Routine general medical examination at a health care facility   [facility] Donita Brooks, MD   3 years ago Rib pain on left side   [facility] Tanya Nones, Priscille Heidelberg, MD   3 years ago Routine general medical examination at a health care facility   [facility] Pickard, Priscille Heidelberg, MD   4 years ago Chronic frontal sinusitis   [facility] Pickard, Priscille Heidelberg, MD

## 2022-09-15 ENCOUNTER — Other Ambulatory Visit: Payer: Self-pay | Admitting: Family Medicine

## 2022-09-18 NOTE — Telephone Encounter (Signed)
Requested Prescriptions  Pending Prescriptions Disp Refills   atorvastatin (LIPITOR) 40 MG tablet [Pharmacy Med Name: Atorvastatin Calcium 40 MG Oral Tablet] 90 tablet 1    Sig: Take 1 tablet by mouth once daily     Cardiovascular:  Antilipid - Statins Failed - 09/15/2022  3:19 PM      Failed - Valid encounter within last 12 months    Recent Outpatient Visits           1 year ago Routine general medical examination at a health care facility   [facility] Medicine Pickard, Priscille Heidelberg, MD   2 years ago Routine general medical examination at a health care facility   [facility] Medicine Donita Brooks, MD   3 years ago Rib pain on left side   [facility] Tanya Nones, Priscille Heidelberg, MD   3 years ago Routine general medical examination at a health care facility   [facility] Pickard, Priscille Heidelberg, MD   4 years ago Chronic frontal sinusitis   [facility] Tanya Nones, Priscille Heidelberg, MD              Failed - Lipid Panel in normal range within the last 12 months    Cholesterol  Date Value Ref Range Status  01/24/2022 118 <200 mg/dL Final   LDL Cholesterol (Calc)  Date Value Ref Range Status  01/24/2022 62 mg/dL (calc) Final    Comment:    Reference range: <100 . Desirable range <100 mg/dL for primary prevention;   <70 mg/dL for patients with CHD or diabetic patients  with > or = 2 CHD risk factors. Marland Kitchen LDL-C is now calculated using the Martin-Hopkins  calculation, which is a validated novel method providing  better accuracy than the Friedewald equation in the  estimation of LDL-C.  Horald Pollen et al. Lenox Ahr. 1610;960(45): 2061-2068  (http://education.[facility].com/faq/FAQ164)    HDL  Date Value Ref Range Status  01/24/2022 36 (L) > OR = 40 mg/dL Final   Triglycerides  Date Value Ref Range Status  01/24/2022 118 <150 mg/dL Final         Passed - Patient is not pregnant

## 2022-10-11 DIAGNOSIS — R350 Frequency of micturition: Secondary | ICD-10-CM | POA: Diagnosis not present

## 2022-10-17 DIAGNOSIS — M9901 Segmental and somatic dysfunction of cervical region: Secondary | ICD-10-CM | POA: Diagnosis not present

## 2022-10-17 DIAGNOSIS — M25552 Pain in left hip: Secondary | ICD-10-CM | POA: Diagnosis not present

## 2022-10-17 DIAGNOSIS — M9903 Segmental and somatic dysfunction of lumbar region: Secondary | ICD-10-CM | POA: Diagnosis not present

## 2022-10-17 DIAGNOSIS — M9902 Segmental and somatic dysfunction of thoracic region: Secondary | ICD-10-CM | POA: Diagnosis not present

## 2022-10-22 DIAGNOSIS — M9903 Segmental and somatic dysfunction of lumbar region: Secondary | ICD-10-CM | POA: Diagnosis not present

## 2022-10-22 DIAGNOSIS — M9901 Segmental and somatic dysfunction of cervical region: Secondary | ICD-10-CM | POA: Diagnosis not present

## 2022-10-22 DIAGNOSIS — M9902 Segmental and somatic dysfunction of thoracic region: Secondary | ICD-10-CM | POA: Diagnosis not present

## 2022-10-22 DIAGNOSIS — M25552 Pain in left hip: Secondary | ICD-10-CM | POA: Diagnosis not present

## 2022-10-23 DIAGNOSIS — H18511 Endothelial corneal dystrophy, right eye: Secondary | ICD-10-CM | POA: Diagnosis not present

## 2022-10-23 DIAGNOSIS — M9903 Segmental and somatic dysfunction of lumbar region: Secondary | ICD-10-CM | POA: Diagnosis not present

## 2022-10-23 DIAGNOSIS — M9902 Segmental and somatic dysfunction of thoracic region: Secondary | ICD-10-CM | POA: Diagnosis not present

## 2022-10-23 DIAGNOSIS — M9901 Segmental and somatic dysfunction of cervical region: Secondary | ICD-10-CM | POA: Diagnosis not present

## 2022-10-23 DIAGNOSIS — H18512 Endothelial corneal dystrophy, left eye: Secondary | ICD-10-CM | POA: Diagnosis not present

## 2022-10-23 DIAGNOSIS — M25552 Pain in left hip: Secondary | ICD-10-CM | POA: Diagnosis not present

## 2022-10-23 DIAGNOSIS — Z947 Corneal transplant status: Secondary | ICD-10-CM | POA: Diagnosis not present

## 2022-10-24 DIAGNOSIS — M25552 Pain in left hip: Secondary | ICD-10-CM | POA: Diagnosis not present

## 2022-10-24 DIAGNOSIS — M9903 Segmental and somatic dysfunction of lumbar region: Secondary | ICD-10-CM | POA: Diagnosis not present

## 2022-10-24 DIAGNOSIS — M9901 Segmental and somatic dysfunction of cervical region: Secondary | ICD-10-CM | POA: Diagnosis not present

## 2022-10-24 DIAGNOSIS — M9902 Segmental and somatic dysfunction of thoracic region: Secondary | ICD-10-CM | POA: Diagnosis not present

## 2022-10-29 DIAGNOSIS — M9901 Segmental and somatic dysfunction of cervical region: Secondary | ICD-10-CM | POA: Diagnosis not present

## 2022-10-29 DIAGNOSIS — M9902 Segmental and somatic dysfunction of thoracic region: Secondary | ICD-10-CM | POA: Diagnosis not present

## 2022-10-29 DIAGNOSIS — M9903 Segmental and somatic dysfunction of lumbar region: Secondary | ICD-10-CM | POA: Diagnosis not present

## 2022-10-29 DIAGNOSIS — M25552 Pain in left hip: Secondary | ICD-10-CM | POA: Diagnosis not present

## 2022-10-31 DIAGNOSIS — M9902 Segmental and somatic dysfunction of thoracic region: Secondary | ICD-10-CM | POA: Diagnosis not present

## 2022-10-31 DIAGNOSIS — M9903 Segmental and somatic dysfunction of lumbar region: Secondary | ICD-10-CM | POA: Diagnosis not present

## 2022-10-31 DIAGNOSIS — M9901 Segmental and somatic dysfunction of cervical region: Secondary | ICD-10-CM | POA: Diagnosis not present

## 2022-10-31 DIAGNOSIS — M25552 Pain in left hip: Secondary | ICD-10-CM | POA: Diagnosis not present

## 2022-11-05 DIAGNOSIS — M25552 Pain in left hip: Secondary | ICD-10-CM | POA: Diagnosis not present

## 2022-11-05 DIAGNOSIS — M9903 Segmental and somatic dysfunction of lumbar region: Secondary | ICD-10-CM | POA: Diagnosis not present

## 2022-11-05 DIAGNOSIS — M9902 Segmental and somatic dysfunction of thoracic region: Secondary | ICD-10-CM | POA: Diagnosis not present

## 2022-11-05 DIAGNOSIS — M9901 Segmental and somatic dysfunction of cervical region: Secondary | ICD-10-CM | POA: Diagnosis not present

## 2022-11-07 DIAGNOSIS — M25552 Pain in left hip: Secondary | ICD-10-CM | POA: Diagnosis not present

## 2022-11-07 DIAGNOSIS — M9901 Segmental and somatic dysfunction of cervical region: Secondary | ICD-10-CM | POA: Diagnosis not present

## 2022-11-07 DIAGNOSIS — M9902 Segmental and somatic dysfunction of thoracic region: Secondary | ICD-10-CM | POA: Diagnosis not present

## 2022-11-07 DIAGNOSIS — M9903 Segmental and somatic dysfunction of lumbar region: Secondary | ICD-10-CM | POA: Diagnosis not present

## 2022-11-12 DIAGNOSIS — M9901 Segmental and somatic dysfunction of cervical region: Secondary | ICD-10-CM | POA: Diagnosis not present

## 2022-11-12 DIAGNOSIS — M25552 Pain in left hip: Secondary | ICD-10-CM | POA: Diagnosis not present

## 2022-11-12 DIAGNOSIS — M9902 Segmental and somatic dysfunction of thoracic region: Secondary | ICD-10-CM | POA: Diagnosis not present

## 2022-11-12 DIAGNOSIS — M9903 Segmental and somatic dysfunction of lumbar region: Secondary | ICD-10-CM | POA: Diagnosis not present

## 2022-11-14 DIAGNOSIS — M9903 Segmental and somatic dysfunction of lumbar region: Secondary | ICD-10-CM | POA: Diagnosis not present

## 2022-11-14 DIAGNOSIS — M9902 Segmental and somatic dysfunction of thoracic region: Secondary | ICD-10-CM | POA: Diagnosis not present

## 2022-11-14 DIAGNOSIS — M9901 Segmental and somatic dysfunction of cervical region: Secondary | ICD-10-CM | POA: Diagnosis not present

## 2022-11-14 DIAGNOSIS — M25552 Pain in left hip: Secondary | ICD-10-CM | POA: Diagnosis not present

## 2022-11-15 DIAGNOSIS — R350 Frequency of micturition: Secondary | ICD-10-CM | POA: Diagnosis not present

## 2022-11-19 DIAGNOSIS — M9901 Segmental and somatic dysfunction of cervical region: Secondary | ICD-10-CM | POA: Diagnosis not present

## 2022-11-19 DIAGNOSIS — M25552 Pain in left hip: Secondary | ICD-10-CM | POA: Diagnosis not present

## 2022-11-19 DIAGNOSIS — M9902 Segmental and somatic dysfunction of thoracic region: Secondary | ICD-10-CM | POA: Diagnosis not present

## 2022-11-19 DIAGNOSIS — M9903 Segmental and somatic dysfunction of lumbar region: Secondary | ICD-10-CM | POA: Diagnosis not present

## 2022-11-20 DIAGNOSIS — M9902 Segmental and somatic dysfunction of thoracic region: Secondary | ICD-10-CM | POA: Diagnosis not present

## 2022-11-20 DIAGNOSIS — M25552 Pain in left hip: Secondary | ICD-10-CM | POA: Diagnosis not present

## 2022-11-20 DIAGNOSIS — M9901 Segmental and somatic dysfunction of cervical region: Secondary | ICD-10-CM | POA: Diagnosis not present

## 2022-11-20 DIAGNOSIS — M9903 Segmental and somatic dysfunction of lumbar region: Secondary | ICD-10-CM | POA: Diagnosis not present

## 2022-11-21 DIAGNOSIS — M9902 Segmental and somatic dysfunction of thoracic region: Secondary | ICD-10-CM | POA: Diagnosis not present

## 2022-11-21 DIAGNOSIS — M9901 Segmental and somatic dysfunction of cervical region: Secondary | ICD-10-CM | POA: Diagnosis not present

## 2022-11-21 DIAGNOSIS — M9903 Segmental and somatic dysfunction of lumbar region: Secondary | ICD-10-CM | POA: Diagnosis not present

## 2022-11-21 DIAGNOSIS — M25552 Pain in left hip: Secondary | ICD-10-CM | POA: Diagnosis not present

## 2022-11-24 ENCOUNTER — Other Ambulatory Visit: Payer: Self-pay | Admitting: Family Medicine

## 2022-11-26 NOTE — Telephone Encounter (Signed)
Requested Prescriptions  Pending Prescriptions Disp Refills   pantoprazole (PROTONIX) 40 MG tablet [Pharmacy Med Name: Pantoprazole Sodium 40 MG Oral Tablet Delayed Release] 90 tablet 0    Sig: Take 1 tablet by mouth once daily     Gastroenterology: Proton Pump Inhibitors Failed - 11/24/2022  9:17 AM      Failed - Valid encounter within last 12 months    Recent Outpatient Visits           1 year ago Routine general medical examination at a health care facility   [facility] Medicine Pickard, Priscille Heidelberg, MD   2 years ago Routine general medical examination at a health care facility   [facility] Medicine Donita Brooks, MD   3 years ago Rib pain on left side   [facility] Tanya Nones, Priscille Heidelberg, MD   4 years ago Routine general medical examination at a health care facility   [facility] Pickard, Priscille Heidelberg, MD   4 years ago Chronic frontal sinusitis   [facility] Pickard, Priscille Heidelberg, MD

## 2022-12-03 DIAGNOSIS — M9901 Segmental and somatic dysfunction of cervical region: Secondary | ICD-10-CM | POA: Diagnosis not present

## 2022-12-03 DIAGNOSIS — M25552 Pain in left hip: Secondary | ICD-10-CM | POA: Diagnosis not present

## 2022-12-03 DIAGNOSIS — M9903 Segmental and somatic dysfunction of lumbar region: Secondary | ICD-10-CM | POA: Diagnosis not present

## 2022-12-03 DIAGNOSIS — M9902 Segmental and somatic dysfunction of thoracic region: Secondary | ICD-10-CM | POA: Diagnosis not present

## 2022-12-11 DIAGNOSIS — M9901 Segmental and somatic dysfunction of cervical region: Secondary | ICD-10-CM | POA: Diagnosis not present

## 2022-12-11 DIAGNOSIS — M9903 Segmental and somatic dysfunction of lumbar region: Secondary | ICD-10-CM | POA: Diagnosis not present

## 2022-12-11 DIAGNOSIS — M9902 Segmental and somatic dysfunction of thoracic region: Secondary | ICD-10-CM | POA: Diagnosis not present

## 2022-12-11 DIAGNOSIS — M25552 Pain in left hip: Secondary | ICD-10-CM | POA: Diagnosis not present

## 2022-12-17 DIAGNOSIS — M25552 Pain in left hip: Secondary | ICD-10-CM | POA: Diagnosis not present

## 2022-12-17 DIAGNOSIS — M9902 Segmental and somatic dysfunction of thoracic region: Secondary | ICD-10-CM | POA: Diagnosis not present

## 2022-12-17 DIAGNOSIS — M9903 Segmental and somatic dysfunction of lumbar region: Secondary | ICD-10-CM | POA: Diagnosis not present

## 2022-12-17 DIAGNOSIS — M9901 Segmental and somatic dysfunction of cervical region: Secondary | ICD-10-CM | POA: Diagnosis not present

## 2022-12-18 DIAGNOSIS — M9902 Segmental and somatic dysfunction of thoracic region: Secondary | ICD-10-CM | POA: Diagnosis not present

## 2022-12-18 DIAGNOSIS — M9903 Segmental and somatic dysfunction of lumbar region: Secondary | ICD-10-CM | POA: Diagnosis not present

## 2022-12-18 DIAGNOSIS — M25552 Pain in left hip: Secondary | ICD-10-CM | POA: Diagnosis not present

## 2022-12-18 DIAGNOSIS — M9901 Segmental and somatic dysfunction of cervical region: Secondary | ICD-10-CM | POA: Diagnosis not present

## 2022-12-24 DIAGNOSIS — M9901 Segmental and somatic dysfunction of cervical region: Secondary | ICD-10-CM | POA: Diagnosis not present

## 2022-12-24 DIAGNOSIS — M9903 Segmental and somatic dysfunction of lumbar region: Secondary | ICD-10-CM | POA: Diagnosis not present

## 2022-12-24 DIAGNOSIS — M9902 Segmental and somatic dysfunction of thoracic region: Secondary | ICD-10-CM | POA: Diagnosis not present

## 2022-12-24 DIAGNOSIS — M25552 Pain in left hip: Secondary | ICD-10-CM | POA: Diagnosis not present

## 2022-12-26 DIAGNOSIS — M9903 Segmental and somatic dysfunction of lumbar region: Secondary | ICD-10-CM | POA: Diagnosis not present

## 2022-12-26 DIAGNOSIS — M9902 Segmental and somatic dysfunction of thoracic region: Secondary | ICD-10-CM | POA: Diagnosis not present

## 2022-12-26 DIAGNOSIS — M25552 Pain in left hip: Secondary | ICD-10-CM | POA: Diagnosis not present

## 2022-12-26 DIAGNOSIS — M9901 Segmental and somatic dysfunction of cervical region: Secondary | ICD-10-CM | POA: Diagnosis not present

## 2022-12-27 DIAGNOSIS — R350 Frequency of micturition: Secondary | ICD-10-CM | POA: Diagnosis not present

## 2022-12-31 DIAGNOSIS — M9903 Segmental and somatic dysfunction of lumbar region: Secondary | ICD-10-CM | POA: Diagnosis not present

## 2022-12-31 DIAGNOSIS — M9901 Segmental and somatic dysfunction of cervical region: Secondary | ICD-10-CM | POA: Diagnosis not present

## 2022-12-31 DIAGNOSIS — M25552 Pain in left hip: Secondary | ICD-10-CM | POA: Diagnosis not present

## 2022-12-31 DIAGNOSIS — M9902 Segmental and somatic dysfunction of thoracic region: Secondary | ICD-10-CM | POA: Diagnosis not present

## 2023-01-07 ENCOUNTER — Ambulatory Visit (INDEPENDENT_AMBULATORY_CARE_PROVIDER_SITE_OTHER): Payer: Medicare HMO | Admitting: Family Medicine

## 2023-01-07 ENCOUNTER — Encounter: Payer: Self-pay | Admitting: Family Medicine

## 2023-01-07 ENCOUNTER — Ambulatory Visit (HOSPITAL_COMMUNITY)
Admission: RE | Admit: 2023-01-07 | Discharge: 2023-01-07 | Disposition: A | Discharge status: Home / Self Care | Payer: Medicare HMO | Source: Ambulatory Visit | Attending: Family Medicine | Admitting: Family Medicine

## 2023-01-07 VITALS — BP 122/70 | HR 77 | Temp 98.2°F | Ht 73.0 in | Wt 207.4 lb

## 2023-01-07 DIAGNOSIS — M5126 Other intervertebral disc displacement, lumbar region: Secondary | ICD-10-CM | POA: Diagnosis not present

## 2023-01-07 DIAGNOSIS — I700 Atherosclerosis of aorta: Secondary | ICD-10-CM | POA: Diagnosis not present

## 2023-01-07 DIAGNOSIS — G8929 Other chronic pain: Secondary | ICD-10-CM

## 2023-01-07 DIAGNOSIS — M5441 Lumbago with sciatica, right side: Secondary | ICD-10-CM | POA: Diagnosis not present

## 2023-01-07 DIAGNOSIS — M4186 Other forms of scoliosis, lumbar region: Secondary | ICD-10-CM | POA: Diagnosis not present

## 2023-01-07 DIAGNOSIS — M47816 Spondylosis without myelopathy or radiculopathy, lumbar region: Secondary | ICD-10-CM | POA: Diagnosis not present

## 2023-01-07 NOTE — Progress Notes (Signed)
Subjective:    Patient ID: Michael Nguyen, male    DOB: May 05, 1940, 82 y.o.   MRN: [id number]   Patient states for 5 months now, he has been dealing with pain in his lower back.  The pain is located primarily in the midline and in his posterior right hip.  He states that hurts with standing.  He is unable to stand for more than 2 songs at church without having to sit down due to the pain located in his lower back.  It hurts to stand and walk.  He can no longer walk around the grocery store due to pain in his back.  He is also getting numbness and tingling around his right knee.  He states his right knee feels like it is waking up from having fallen asleep.  There is no pain with flexion or extension of his knee.  There is no fluid or swelling around his knee.  Patient states that he has no pain in his back as long as he sitting however as soon as he stands up the pain returns. Past Medical History:  Diagnosis Date   Allergy    Arthritis    "some" per pt   BPH (benign prostatic hypertrophy)    Cancer (HCC)    skin- basal and squamous cell CA   Cataract    GERD (gastroesophageal reflux disease)    History of ETT    with dr. Elease Hashimoto was normal per patient   Hyperlipidemia    Hypertension    Past Surgical History:  Procedure Laterality Date   cataracts     both eyes   COLONOSCOPY     TONSILLECTOMY     Current Outpatient Medications on File Prior to Visit  Medication Sig Dispense Refill   ALOE VERA PO Take by mouth daily.     aspirin 81 MG tablet Take 81 mg by mouth daily.     atorvastatin (LIPITOR) 40 MG tablet Take 1 tablet by mouth once daily 90 tablet 1   Cholecalciferol (VITAMIN D3) 1000 UNITS CAPS Take 1 capsule by mouth 2 (two) times daily.     fexofenadine (ALLEGRA) 180 MG tablet Take 180 mg by mouth daily.     fish oil-omega-3 fatty acids 1000 MG capsule Take 1 g by mouth 2 (two) times daily.     fluticasone (FLONASE) 50 MCG/ACT nasal spray Place 2 sprays into both nostrils  daily.     GLUCOSAMINE-CHONDROITIN-VIT C PO Take 2,000 mg by mouth daily.     losartan (COZAAR) 50 MG tablet Take 1 tablet (50 mg total) by mouth daily. 90 tablet 3   montelukast (SINGULAIR) 10 MG tablet TAKE 1 TABLET BY MOUTH AT BEDTIME.(APPOINTMENT REQUIRED FOR FUTURE REFILLS.) 90 tablet 2   Multiple Vitamin (MULTIVITAMIN) tablet Take 1 tablet by mouth daily.     naproxen sodium (ALEVE) 220 MG tablet Take 220 mg by mouth as needed.     pantoprazole (PROTONIX) 40 MG tablet Take 1 tablet by mouth once daily 90 tablet 0   No current facility-administered medications on file prior to visit.   Allergies  Allergen Reactions   Prednisone     REACTION: ? intolerance per patient / Cramps   Social History   Socioeconomic History   Marital status: Married    Spouse name: Not on file   Number of children: 2   Years of education: Not on file   Highest education level: GED or equivalent  Occupational History   Occupation: Curator  Tobacco Use   Smoking status: Former    Current packs/day: 0.00    Types: Cigarettes    Quit date: 01/23/1967    Years since quitting: 55.9   Smokeless tobacco: Never  Vaping Use   Vaping status: Never Used  Substance and Sexual Activity   Alcohol use: No   Drug use: No   Sexual activity: Not on file  Other Topics Concern   Not on file  Social History Narrative   Lives in [place]   Social Drivers of Health   Financial Resource Strain: Low Risk  (07/30/2022)   Overall Financial Resource Strain (CARDIA)    Difficulty of Paying Living Expenses: Not hard at all  Food Insecurity: No Food Insecurity (07/30/2022)   Hunger Vital Sign    Worried About Running Out of Food in the Last Year: Never true    Ran Out of Food in the Last Year: Never true  Transportation Needs: No Transportation Needs (07/30/2022)   PRAPARE - Administrator, Civil Service (Medical): No    Lack of Transportation (Non-Medical): No  Physical Activity: Inactive (07/30/2022)    Exercise Vital Sign    Days of Exercise per Week: 0 days    Minutes of Exercise per Session: 0 min  Stress: No Stress Concern Present (07/30/2022)   [employer] of Occupational Health - Occupational Stress Questionnaire    Feeling of Stress : Not at all  Social Connections: Socially Integrated (07/30/2022)   Social Connection and Isolation Panel [NHANES]    Frequency of Communication with Friends and Family: Twice a week    Frequency of Social Gatherings with Friends and Family: Once a week    Attends Religious Services: More than 4 times per year    Active Member of [employer] or Organizations: Yes    Attends Engineer, structural: More than 4 times per year    Marital Status: Married  Catering manager Violence: Not At Risk (01/30/2022)   Humiliation, Afraid, Rape, and Kick questionnaire    Fear of Current or Ex-Partner: No    Emotionally Abused: No    Physically Abused: No    Sexually Abused: No      Review of Systems  HENT:  Negative for nosebleeds, postnasal drip, rhinorrhea, sinus pressure, sinus pain, sore throat and trouble swallowing.   Cardiovascular:  Negative for chest pain, palpitations and leg swelling.  Gastrointestinal:  Negative for nausea and vomiting.  Musculoskeletal:  Positive for back pain.  All other systems reviewed and are negative.      Objective:   Physical Exam Constitutional:      General: He is not in acute distress.    Appearance: He is well-developed. He is not diaphoretic.  HENT:     Head: Normocephalic and atraumatic.     Right Ear: External ear normal.     Left Ear: External ear normal.  Neck:     Thyroid: No thyromegaly.     Vascular: No JVD.  Cardiovascular:     Rate and Rhythm: Normal rate and regular rhythm.     Pulses:          Carotid pulses are 2+ on the right side and 2+ on the left side.    Heart sounds: Normal heart sounds. No murmur heard.    No friction rub. No gallop.  Pulmonary:     Effort: Pulmonary effort is  normal. No respiratory distress.     Breath sounds: No decreased breath sounds, wheezing, rhonchi  or rales.  Chest:     Chest wall: No tenderness or crepitus.  Abdominal:     General: Bowel sounds are normal. There is no distension.     Palpations: Abdomen is soft.     Tenderness: There is no abdominal tenderness. There is no guarding or rebound.  Musculoskeletal:     Cervical back: Neck supple.     Lumbar back: No spasms, tenderness or bony tenderness. Normal range of motion. Negative right straight leg raise test and negative left straight leg raise test.       Back:  Lymphadenopathy:     Cervical: No cervical adenopathy.  Neurological:     Mental Status: He is alert and oriented to person, place, and time.     Cranial Nerves: No cranial nerve deficit.     Motor: No abnormal muscle tone.     Coordination: Coordination normal.     Deep Tendon Reflexes: Reflexes are normal and symmetric.        Assessment & Plan:  Chronic midline low back pain with right-sided sciatica - Plan: DG Lumbar Spine Complete start by obtaining x-rays of the lumbar spine.  I believe the pain around his right knee may be a nerve pain referred from his lower back given the characteristics of the pain.  I suspect degenerative disc disease and possibly even spinal stenosis.  Based upon x-ray results, we will likely suggest trying meloxicam 15 mg daily.  The patient has been seeing a chiropractor for 4-month without benefit

## 2023-01-18 ENCOUNTER — Telehealth: Payer: Self-pay

## 2023-01-18 NOTE — Telephone Encounter (Signed)
Copied from CRM [id number]. Topic: Clinical - Lab/Test Results >> Jan 18, 2023  9:54 AM Clayton Bibles wrote: Reason for CRM: Please call Michael Nguyen with the results of his Xray of back. His number is [phone].

## 2023-01-21 ENCOUNTER — Other Ambulatory Visit: Payer: Self-pay

## 2023-01-21 MED ORDER — MELOXICAM 15 MG PO TABS: 15.0000 mg | ORAL_TABLET | Freq: Every day | ORAL | 0 refills | Status: DC

## 2023-01-21 NOTE — Telephone Encounter (Signed)
Spoke w/pt re xray results and pcp's recommendations, per pt agrees and voiced understanding. Rx sent to pt's pharmacy.

## 2023-02-07 ENCOUNTER — Telehealth: Payer: Self-pay

## 2023-02-07 DIAGNOSIS — R350 Frequency of micturition: Secondary | ICD-10-CM | POA: Diagnosis not present

## 2023-02-07 NOTE — Telephone Encounter (Signed)
Copied from CRM [id number]. Topic: Clinical - Medical Advice >> Feb 07, 2023  9:44 AM Gildardo Pounds wrote: Reason for CRM: Patient was prescribed Mobic but it caused diarrhea. Patient would like something else for pain. Callback number is [phone]

## 2023-02-08 ENCOUNTER — Other Ambulatory Visit: Payer: Self-pay | Admitting: Family Medicine

## 2023-02-08 MED ORDER — DICLOFENAC SODIUM 75 MG PO TBEC: 75.0000 mg | DELAYED_RELEASE_TABLET | Freq: Two times a day (BID) | ORAL | 3 refills | Status: DC

## 2023-02-09 ENCOUNTER — Other Ambulatory Visit: Payer: Self-pay | Admitting: Family Medicine

## 2023-02-11 NOTE — Telephone Encounter (Signed)
Requested medication (s) are due for refill today:   Yes  Requested medication (s) are on the active medication list:   Yes  Future visit scheduled:   No.  Last CPE 02/15/2022      Last ordered: 02/15/2022 #90, 3 refills  Unable to refill because physical and labs are due.     Requested Prescriptions  Pending Prescriptions Disp Refills   losartan (COZAAR) 50 MG tablet [Pharmacy Med Name: Losartan Potassium 50 MG Oral Tablet] 90 tablet 0    Sig: Take 1 tablet by mouth once daily     Cardiovascular:  Angiotensin Receptor Blockers Failed - 02/11/2023 10:02 AM      Failed - Cr in normal range and within 180 days    Creat  Date Value Ref Range Status  08/03/2022 0.96 0.70 - 1.22 mg/dL Final         Failed - K in normal range and within 180 days    Potassium  Date Value Ref Range Status  08/03/2022 4.2 3.5 - 5.3 mmol/L Final         Failed - Valid encounter within last 6 months    Recent Outpatient Visits           2 years ago Routine general medical examination at a health care facility   [facility] Donita Brooks, MD   3 years ago Routine general medical examination at a health care facility   [facility] Medicine Donita Brooks, MD   3 years ago Rib pain on left side   [facility] Tanya Nones, Priscille Heidelberg, MD   4 years ago Routine general medical examination at a health care facility   [facility] Pickard, Priscille Heidelberg, MD   4 years ago Chronic frontal sinusitis   [facility] Pickard, Priscille Heidelberg, MD              Passed - Patient is not pregnant      Passed - Last BP in normal range    BP Readings from Last 1 Encounters:  01/07/23 122/70

## 2023-02-27 ENCOUNTER — Other Ambulatory Visit: Payer: Self-pay

## 2023-02-27 ENCOUNTER — Telehealth: Payer: Self-pay

## 2023-02-27 MED ORDER — LOSARTAN POTASSIUM 50 MG PO TABS: 50.0000 mg | ORAL_TABLET | Freq: Every day | ORAL | 3 refills | Status: DC

## 2023-02-27 NOTE — Telephone Encounter (Signed)
 Copied from CRM [id number]. Topic: Clinical - Prescription Issue >> Feb 27, 2023  9:33 AM Jorie Newness J wrote: Reason for CRM: Pt states his pharmacy Is awaiting authorization for losartan  (COZAAR ) 50 MG tablet. The pt is out of the medication

## 2023-02-28 ENCOUNTER — Other Ambulatory Visit: Payer: Self-pay | Admitting: Family Medicine

## 2023-03-21 DIAGNOSIS — R350 Frequency of micturition: Secondary | ICD-10-CM | POA: Diagnosis not present

## 2023-04-26 DIAGNOSIS — Z85828 Personal history of other malignant neoplasm of skin: Secondary | ICD-10-CM | POA: Diagnosis not present

## 2023-04-26 DIAGNOSIS — L570 Actinic keratosis: Secondary | ICD-10-CM | POA: Diagnosis not present

## 2023-04-26 DIAGNOSIS — L821 Other seborrheic keratosis: Secondary | ICD-10-CM | POA: Diagnosis not present

## 2023-04-26 DIAGNOSIS — L814 Other melanin hyperpigmentation: Secondary | ICD-10-CM | POA: Diagnosis not present

## 2023-04-26 DIAGNOSIS — D1801 Hemangioma of skin and subcutaneous tissue: Secondary | ICD-10-CM | POA: Diagnosis not present

## 2023-05-02 DIAGNOSIS — R350 Frequency of micturition: Secondary | ICD-10-CM | POA: Diagnosis not present

## 2023-06-02 ENCOUNTER — Other Ambulatory Visit: Payer: Self-pay | Admitting: Family Medicine

## 2023-06-04 ENCOUNTER — Other Ambulatory Visit

## 2023-06-04 DIAGNOSIS — Z125 Encounter for screening for malignant neoplasm of prostate: Secondary | ICD-10-CM | POA: Diagnosis not present

## 2023-06-04 DIAGNOSIS — E7800 Pure hypercholesterolemia, unspecified: Secondary | ICD-10-CM

## 2023-06-04 DIAGNOSIS — I10 Essential (primary) hypertension: Secondary | ICD-10-CM

## 2023-06-04 LAB — CBC WITH DIFFERENTIAL/PLATELET
Absolute Lymphocytes: 2448 {cells}/uL (ref 850–3900)
Absolute Monocytes: 583 {cells}/uL (ref 200–950)
Basophils Absolute: 43 {cells}/uL (ref 0–200)
Basophils Relative: 0.6 %
Eosinophils Absolute: 223 {cells}/uL (ref 15–500)
Eosinophils Relative: 3.1 %
HCT: 44.7 % (ref 38.5–50.0)
Hemoglobin: 15.1 g/dL (ref 13.2–17.1)
MCH: 31.1 pg (ref 27.0–33.0)
MCHC: 33.8 g/dL (ref 32.0–36.0)
MCV: 92.2 fL (ref 80.0–100.0)
MPV: 11.4 fL (ref 7.5–12.5)
Monocytes Relative: 8.1 %
Neutro Abs: 3902 {cells}/uL (ref 1500–7800)
Neutrophils Relative %: 54.2 %
Platelets: 186 10*3/uL (ref 140–400)
RBC: 4.85 10*6/uL (ref 4.20–5.80)
RDW: 12 % (ref 11.0–15.0)
Total Lymphocyte: 34 %
WBC: 7.2 10*3/uL (ref 3.8–10.8)

## 2023-06-04 LAB — LIPID PANEL
Cholesterol: 146 mg/dL (ref <200)
HDL: 40 mg/dL (ref >=40)
LDL Cholesterol (Calc): 81 mg/dL
Non-HDL Cholesterol (Calc): 106 mg/dL (ref <130)
Total CHOL/HDL Ratio: 3.7 (calc) (ref <5.0)
Triglycerides: 149 mg/dL (ref <150)

## 2023-06-04 LAB — COMPLETE METABOLIC PANEL WITHOUT GFR
AG Ratio: 1.7 (calc) (ref 1.0–2.5)
ALT: 25 U/L (ref 9–46)
AST: 25 U/L (ref 10–35)
Albumin: 4.1 g/dL (ref 3.6–5.1)
Alkaline phosphatase (APISO): 50 U/L (ref 35–144)
BUN: 15 mg/dL (ref 7–25)
CO2: 26 mmol/L (ref 20–32)
Calcium: 9.3 mg/dL (ref 8.6–10.3)
Chloride: 107 mmol/L (ref 98–110)
Creat: 1.21 mg/dL (ref 0.70–1.22)
Globulin: 2.4 g/dL (ref 1.9–3.7)
Glucose, Bld: 93 mg/dL (ref 65–99)
Potassium: 4.1 mmol/L (ref 3.5–5.3)
Sodium: 141 mmol/L (ref 135–146)
Total Bilirubin: 0.8 mg/dL (ref 0.2–1.2)
Total Protein: 6.5 g/dL (ref 6.1–8.1)

## 2023-06-04 LAB — PSA: PSA: 1.68 ng/mL (ref <=4.00)

## 2023-06-04 NOTE — Telephone Encounter (Signed)
 Requested Prescriptions  Pending Prescriptions Disp Refills   pantoprazole  (PROTONIX ) 40 MG tablet [Pharmacy Med Name: Pantoprazole  Sodium 40 MG Oral Tablet Delayed Release] 90 tablet 0    Sig: Take 1 tablet by mouth once daily     Gastroenterology: Proton Pump Inhibitors Passed - 06/04/2023  1:46 PM      Passed - Valid encounter within last 12 months    Recent Outpatient Visits           4 months ago Chronic midline low back pain with right-sided sciatica   [facility] Austine Lefort, MD   10 months ago Benign essential HTN   [facility] Pickard, Cisco Crest, MD   1 year ago Encounter for Medicare annual wellness exam   [facility] Pickard, Cisco Crest, MD

## 2023-06-05 ENCOUNTER — Other Ambulatory Visit: Payer: Self-pay | Admitting: Family Medicine

## 2023-06-06 ENCOUNTER — Ambulatory Visit: Payer: Self-pay | Admitting: Family Medicine

## 2023-06-06 NOTE — Telephone Encounter (Signed)
 Requested Prescriptions  Pending Prescriptions Disp Refills   atorvastatin  (LIPITOR) 40 MG tablet [Pharmacy Med Name: Atorvastatin  Calcium  40 MG Oral Tablet] 90 tablet 0    Sig: Take 1 tablet by mouth once daily     Cardiovascular:  Antilipid - Statins Failed - 06/06/2023  1:43 PM      Failed - Lipid Panel in normal range within the last 12 months    Cholesterol  Date Value Ref Range Status  06/04/2023 146 <200 mg/dL Final   LDL Cholesterol (Calc)  Date Value Ref Range Status  06/04/2023 81 mg/dL (calc) Final    Comment:    Reference range: <100 . Desirable range <100 mg/dL for primary prevention;   <70 mg/dL for patients with CHD or diabetic patients  with > or = 2 CHD risk factors. Aaron Aas LDL-C is now calculated using the Martin-Hopkins  calculation, which is a validated novel method providing  better accuracy than the Friedewald equation in the  estimation of LDL-C.  Melinda Sprawls et al. Erroll Heard. 2952;841(32): 2061-2068  (http://education.[facility].com/faq/FAQ164)    HDL  Date Value Ref Range Status  06/04/2023 40 > OR = 40 mg/dL Final   Triglycerides  Date Value Ref Range Status  06/04/2023 149 <150 mg/dL Final         Passed - Patient is not pregnant      Passed - Valid encounter within last 12 months    Recent Outpatient Visits           5 months ago Chronic midline low back pain with right-sided sciatica   [place] [facility] Austine Lefort, MD   10 months ago Benign essential HTN   [place] [facility] Pickard, Cisco Crest, MD   1 year ago Encounter for Medicare annual wellness exam   [place] [facility] Pickard, Cisco Crest, MD

## 2023-06-07 ENCOUNTER — Ambulatory Visit: Admitting: Family Medicine

## 2023-06-07 VITALS — BP 136/76 | HR 76 | Temp 97.9°F | Ht 73.0 in | Wt 212.0 lb

## 2023-06-07 DIAGNOSIS — M5441 Lumbago with sciatica, right side: Secondary | ICD-10-CM | POA: Diagnosis not present

## 2023-06-07 DIAGNOSIS — Z125 Encounter for screening for malignant neoplasm of prostate: Secondary | ICD-10-CM

## 2023-06-07 DIAGNOSIS — I10 Essential (primary) hypertension: Secondary | ICD-10-CM | POA: Diagnosis not present

## 2023-06-07 DIAGNOSIS — R0602 Shortness of breath: Secondary | ICD-10-CM

## 2023-06-07 DIAGNOSIS — Z0001 Encounter for general adult medical examination with abnormal findings: Secondary | ICD-10-CM

## 2023-06-07 DIAGNOSIS — E7800 Pure hypercholesterolemia, unspecified: Secondary | ICD-10-CM

## 2023-06-07 DIAGNOSIS — G8929 Other chronic pain: Secondary | ICD-10-CM

## 2023-06-07 DIAGNOSIS — Z0000 Encounter for general adult medical examination without abnormal findings: Secondary | ICD-10-CM

## 2023-06-07 MED ORDER — CELECOXIB 200 MG PO CAPS: 200.0000 mg | ORAL_CAPSULE | Freq: Two times a day (BID) | ORAL | 3 refills | Status: DC

## 2023-06-07 NOTE — Progress Notes (Signed)
 Subjective:    Patient ID: Michael Nguyen, male    DOB: 01-26-1940, 83 y.o.   MRN: [id number]  HPI  Patient is a very pleasant 83 year old Caucasian gentleman here today for complete physical exam.  Patient has been dealing with low back pain this year.  He is now complaining of low back pain with pain in his upper gluteus region bilaterally.  It hurts at night when he is trying to sleep.  It hurts when he stands for prolonged periods of time.  The patient had significant degenerative disc disease particularly at L5-S1 on an x-ray he has been back on the pain.  He tried seeing a [facility].  Meloxicam  provided little relief.  Recently he has been dealing with swelling in both legs.  He has +1 pitting edema in both legs up to his mid shin.  He has numerous spider veins on both feet and a history of chronic venous insufficiency.  I suspect that this is the source of his leg swelling however he is also has been noticing some shortness of breath when walking.  He states that he gets more easily winded walking up the hill in his backyard.  He denies any chest pain. Lab on 06/04/2023  Component Date Value Ref Range Status   WBC 06/04/2023 7.2  3.8 - 10.8 Thousand/uL Final   RBC 06/04/2023 4.85  4.20 - 5.80 Million/uL Final   Hemoglobin 06/04/2023 15.1  13.2 - 17.1 g/dL Final   HCT 40/98/1191 44.7  38.5 - 50.0 % Final   MCV 06/04/2023 92.2  80.0 - 100.0 fL Final   MCH 06/04/2023 31.1  27.0 - 33.0 pg Final   MCHC 06/04/2023 33.8  32.0 - 36.0 g/dL Final   Comment: For adults, a slight decrease in the calculated MCHC value (in the range of 30 to 32 g/dL) is most likely not clinically significant; however, it should be interpreted with caution in correlation with other red cell parameters and the patient's clinical condition.    RDW 06/04/2023 12.0  11.0 - 15.0 % Final   Platelets 06/04/2023 186  140 - 400 Thousand/uL Final   MPV 06/04/2023 11.4  7.5 - 12.5 fL Final   Neutro Abs 06/04/2023 3,902   1,500 - 7,800 cells/uL Final   Absolute Lymphocytes 06/04/2023 2,448  850 - 3,900 cells/uL Final   Absolute Monocytes 06/04/2023 583  200 - 950 cells/uL Final   Eosinophils Absolute 06/04/2023 223  15 - 500 cells/uL Final   Basophils Absolute 06/04/2023 43  0 - 200 cells/uL Final   Neutrophils Relative % 06/04/2023 54.2  % Final   Total Lymphocyte 06/04/2023 34.0  % Final   Monocytes Relative 06/04/2023 8.1  % Final   Eosinophils Relative 06/04/2023 3.1  % Final   Basophils Relative 06/04/2023 0.6  % Final   Glucose, Bld 06/04/2023 93  65 - 99 mg/dL Final   Comment: .            Fasting reference interval .    BUN 06/04/2023 15  7 - 25 mg/dL Final   Creat 47/82/9562 1.21  0.70 - 1.22 mg/dL Final   BUN/Creatinine Ratio 06/04/2023 SEE NOTE:  6 - 22 (calc) Final   Comment:    Not Reported: BUN and Creatinine are within    reference range. .    Sodium 06/04/2023 141  135 - 146 mmol/L Final   Potassium 06/04/2023 4.1  3.5 - 5.3 mmol/L Final   Chloride 06/04/2023 107  98 -  110 mmol/L Final   CO2 06/04/2023 26  20 - 32 mmol/L Final   Calcium  06/04/2023 9.3  8.6 - 10.3 mg/dL Final   Total Protein 40/10/2723 6.5  6.1 - 8.1 g/dL Final   Albumin 36/64/4034 4.1  3.6 - 5.1 g/dL Final   Globulin 74/25/9563 2.4  1.9 - 3.7 g/dL (calc) Final   AG Ratio 06/04/2023 1.7  1.0 - 2.5 (calc) Final   Total Bilirubin 06/04/2023 0.8  0.2 - 1.2 mg/dL Final   Alkaline phosphatase (APISO) 06/04/2023 50  35 - 144 U/L Final   AST 06/04/2023 25  10 - 35 U/L Final   ALT 06/04/2023 25  9 - 46 U/L Final   Cholesterol 06/04/2023 146  <200 mg/dL Final   HDL 87/56/4332 40  > OR = 40 mg/dL Final   Triglycerides 95/18/8416 149  <150 mg/dL Final   LDL Cholesterol (Calc) 06/04/2023 81  mg/dL (calc) Final   Comment: Reference range: <100 . Desirable range <100 mg/dL for primary prevention;   <70 mg/dL for patients with CHD or diabetic patients  with > or = 2 CHD risk factors. Aaron Aas LDL-C is now calculated using the  Martin-Hopkins  calculation, which is a validated novel method providing  better accuracy than the Friedewald equation in the  estimation of LDL-C.  Melinda Sprawls et al. Erroll Heard. 6063;016(01): 2061-2068  (http://education.[facility].com/faq/FAQ164)    Total CHOL/HDL Ratio 06/04/2023 3.7  <0.9 (calc) Final   Non-HDL Cholesterol (Calc) 06/04/2023 106  <130 mg/dL (calc) Final   Comment: For patients with diabetes plus 1 major ASCVD risk  factor, treating to a non-HDL-C goal of <100 mg/dL  (LDL-C of <32 mg/dL) is considered a therapeutic  option.    PSA 06/04/2023 1.68  < OR = 4.00 ng/mL Final   Comment: The total PSA value from this assay system is  standardized against the WHO standard. The test  result will be approximately 20% lower when compared  to the equimolar-standardized total PSA (Beckman  Coulter). Comparison of serial PSA results should be  interpreted with this fact in mind. . This test was performed using the Siemens  chemiluminescent method. Values obtained from  different assay methods cannot be used interchangeably. PSA levels, regardless of value, should not be interpreted as absolute evidence of the presence or absence of disease.     Past Medical History:  Diagnosis Date   Allergy    Arthritis    "some" per pt   BPH (benign prostatic hypertrophy)    Cancer (HCC)    skin- basal and squamous cell CA   Cataract    GERD (gastroesophageal reflux disease)    History of ETT    with dr. Alroy Aspen was normal per patient   Hyperlipidemia    Hypertension    Past Surgical History:  Procedure Laterality Date   cataracts     both eyes   COLONOSCOPY     TONSILLECTOMY     Current Outpatient Medications on File Prior to Visit  Medication Sig Dispense Refill   ALOE VERA PO Take by mouth daily.     aspirin 81 MG tablet Take 81 mg by mouth daily.     atorvastatin  (LIPITOR) 40 MG tablet Take 1 tablet by mouth once daily 90 tablet 1   Cholecalciferol (VITAMIN D3) 1000  UNITS CAPS Take 1 capsule by mouth 2 (two) times daily.     diclofenac  (VOLTAREN ) 75 MG EC tablet Take 1 tablet (75 mg total) by mouth 2 (two) times daily.  60 tablet 3   fexofenadine (ALLEGRA) 180 MG tablet Take 180 mg by mouth daily.     fish oil-omega-3 fatty acids 1000 MG capsule Take 1 g by mouth 2 (two) times daily.     fluticasone  (FLONASE ) 50 MCG/ACT nasal spray Place 2 sprays into both nostrils daily.     GLUCOSAMINE-CHONDROITIN-VIT C PO Take 2,000 mg by mouth daily.     losartan  (COZAAR ) 50 MG tablet Take 1 tablet (50 mg total) by mouth daily. 90 tablet 3   montelukast  (SINGULAIR ) 10 MG tablet TAKE 1 TABLET BY MOUTH AT BEDTIME.(APPOINTMENT REQUIRED FOR FUTURE REFILLS.) 90 tablet 2   Multiple Vitamin (MULTIVITAMIN) tablet Take 1 tablet by mouth daily.     naproxen sodium (ALEVE) 220 MG tablet Take 220 mg by mouth as needed.     pantoprazole  (PROTONIX ) 40 MG tablet Take 1 tablet by mouth once daily 90 tablet 0   No current facility-administered medications on file prior to visit.   Allergies  Allergen Reactions   Prednisone      REACTION: ? intolerance per patient / Cramps   Social History   Socioeconomic History   Marital status: Married    Spouse name: Not on file   Number of children: 2   Years of education: Not on file   Highest education level: GED or equivalent  Occupational History   Occupation: Curator  Tobacco Use   Smoking status: Former    Current packs/day: 0.00    Types: Cigarettes    Quit date: 01/23/1967    Years since quitting: 56.4   Smokeless tobacco: Never  Vaping Use   Vaping status: Never Used  Substance and Sexual Activity   Alcohol use: No   Drug use: No   Sexual activity: Not on file  Other Topics Concern   Not on file  Social History Narrative   Lives in [place]   Social Drivers of Health   Financial Resource Strain: Low Risk  (07/30/2022)   Overall Financial Resource Strain (CARDIA)    Difficulty of Paying Living Expenses: Not hard  at all  Food Insecurity: No Food Insecurity (07/30/2022)   Hunger Vital Sign    Worried About Running Out of Food in the Last Year: Never true    Ran Out of Food in the Last Year: Never true  Transportation Needs: No Transportation Needs (07/30/2022)   PRAPARE - Administrator, Civil Service (Medical): No    Lack of Transportation (Non-Medical): No  Physical Activity: Inactive (07/30/2022)   Exercise Vital Sign    Days of Exercise per Week: 0 days    Minutes of Exercise per Session: 0 min  Stress: No Stress Concern Present (07/30/2022)   [employer] of Occupational Health - Occupational Stress Questionnaire    Feeling of Stress : Not at all  Social Connections: Socially Integrated (07/30/2022)   Social Connection and Isolation Panel [NHANES]    Frequency of Communication with Friends and Family: Twice a week    Frequency of Social Gatherings with Friends and Family: Once a week    Attends Religious Services: More than 4 times per year    Active Member of [employer] or Organizations: Yes    Attends Engineer, structural: More than 4 times per year    Marital Status: Married  Catering manager Violence: Not At Risk (01/30/2022)   Humiliation, Afraid, Rape, and Kick questionnaire    Fear of Current or Ex-Partner: No    Emotionally Abused: No  Physically Abused: No    Sexually Abused: No   Family History  Problem Relation Age of Onset   Coronary artery disease Brother        x2. One had CABG at 61. other with CABG in his 71's. Father with MI in his 33's   Esophageal cancer Father    Breast cancer Mother    Colon cancer Neg Hx    Rectal cancer Neg Hx    Stomach cancer Neg Hx    Colon polyps Neg Hx       Review of Systems  All other systems reviewed and are negative.      Objective:   Physical Exam Vitals reviewed.  Constitutional:      General: He is not in acute distress.    Appearance: He is well-developed. He is not diaphoretic.  HENT:     Head:  Normocephalic and atraumatic.     Right Ear: External ear normal.     Left Ear: External ear normal.     Nose: Nose normal.     Mouth/Throat:     Pharynx: No oropharyngeal exudate.  Eyes:     General: No scleral icterus.       Right eye: No discharge.        Left eye: No discharge.     Conjunctiva/sclera: Conjunctivae normal.     Pupils: Pupils are equal, round, and reactive to light.  Neck:     Thyroid: No thyromegaly.     Vascular: No JVD.     Trachea: No tracheal deviation.  Cardiovascular:     Rate and Rhythm: Normal rate and regular rhythm.     Heart sounds: Normal heart sounds. No murmur heard.    No friction rub. No gallop.  Pulmonary:     Effort: Pulmonary effort is normal. No respiratory distress.     Breath sounds: Normal breath sounds. No stridor. No wheezing or rales.  Chest:     Chest wall: No tenderness.  Abdominal:     General: Bowel sounds are normal. There is no distension.     Palpations: Abdomen is soft. There is no mass.     Tenderness: There is no abdominal tenderness. There is no guarding or rebound.  Genitourinary:    Prostate: Not enlarged and not tender.     Rectum: Normal.  Musculoskeletal:        General: No tenderness. Normal range of motion.     Cervical back: Normal range of motion and neck supple.     Right lower leg: Edema present.     Left lower leg: Edema present.  Lymphadenopathy:     Cervical: No cervical adenopathy.  Skin:    General: Skin is warm.     Coloration: Skin is not pale.     Findings: No erythema or rash.  Neurological:     Mental Status: He is alert and oriented to person, place, and time.     Cranial Nerves: No cranial nerve deficit.     Motor: No abnormal muscle tone.     Coordination: Coordination normal.     Deep Tendon Reflexes: Reflexes are normal and symmetric.  Psychiatric:        Behavior: Behavior normal.        Thought Content: Thought content normal.        Judgment: Judgment normal.         Assessment & Plan:  Shortness of breath - Plan: ECHOCARDIOGRAM COMPLETE  Benign essential HTN  Pure hypercholesterolemia  Prostate cancer screening  Chronic midline low back pain with right-sided sciatica  General medical exam Physical exam today significantly for base edema in both legs distal to the midshin.  I suspect this is likely due to chronic venous insufficiency.  I recommended compression hose.  I would avoid Lasix possible as the patient already has polyuria.  I will schedule an echocardiogram of the heart as the patient has reported some shortness of breath with activity but I suspect that this will be normal.  His blood pressure today is outstanding and his lab work is perfect.  His PSA is within the normal limits.  He is beyond the age of colon cancer screening.  Immunizations are up-to-date.  Will start Celebrex 200 mg twice daily for low back pain.  If not beneficial after 3 to 4 weeks I would recommend a referral for possible epidural steroid injections.

## 2023-06-10 ENCOUNTER — Ambulatory Visit (HOSPITAL_COMMUNITY)
Admission: RE | Admit: 2023-06-10 | Discharge: 2023-06-10 | Disposition: A | Discharge status: Home / Self Care | Source: Ambulatory Visit | Attending: Internal Medicine | Admitting: Internal Medicine

## 2023-06-10 DIAGNOSIS — R0602 Shortness of breath: Secondary | ICD-10-CM

## 2023-06-10 DIAGNOSIS — I77810 Thoracic aortic ectasia: Secondary | ICD-10-CM | POA: Diagnosis not present

## 2023-06-10 DIAGNOSIS — I517 Cardiomegaly: Secondary | ICD-10-CM | POA: Diagnosis not present

## 2023-06-10 LAB — ECHOCARDIOGRAM COMPLETE
Area-P 1/2: 3.6 cm2
S' Lateral: 2.47 cm

## 2023-06-11 ENCOUNTER — Ambulatory Visit: Payer: Self-pay | Admitting: Family Medicine

## 2023-06-13 DIAGNOSIS — R350 Frequency of micturition: Secondary | ICD-10-CM | POA: Diagnosis not present

## 2023-06-14 ENCOUNTER — Other Ambulatory Visit: Payer: Self-pay | Admitting: Family Medicine

## 2023-08-01 DIAGNOSIS — R350 Frequency of micturition: Secondary | ICD-10-CM | POA: Diagnosis not present

## 2023-08-28 ENCOUNTER — Other Ambulatory Visit: Payer: Self-pay | Admitting: Family Medicine

## 2023-09-05 DIAGNOSIS — R350 Frequency of micturition: Secondary | ICD-10-CM | POA: Diagnosis not present

## 2023-09-24 ENCOUNTER — Other Ambulatory Visit: Payer: Self-pay | Admitting: Family Medicine

## 2023-10-04 ENCOUNTER — Other Ambulatory Visit: Payer: Self-pay | Admitting: Family Medicine

## 2023-10-04 NOTE — Telephone Encounter (Signed)
 Copied from CRM [id number]. Topic: Clinical - Medication Refill >> Oct 04, 2023 11:59 AM Tiffini S wrote: Medication:  montelukast  (SINGULAIR ) 10 MG tablet   Has the patient contacted their pharmacy? Yes (Agent: If no, request that the patient contact the pharmacy for the refill. If patient does not wish to contact the pharmacy document the reason why and proceed with request.) (Agent: If yes, when and what did the pharmacy advise?)  This is the patient's preferred pharmacy:  [id number] - [place] ([place]), [place] - [address] [place] ([place]) [place] [address] Phone: [phone] Fax: [phone]  Is this the correct pharmacy for this prescription? Yes If no, delete pharmacy and type the correct one.   Has the prescription been filled recently? Yes  Is the patient out of the medication? Yes  Has the patient been seen for an appointment in the last year OR does the patient have an upcoming appointment? Yes  Can we respond through MyChart? Yes  Agent: Please be advised that Rx refills may take up to 3 business days. We ask that you follow-up with your pharmacy.

## 2023-10-07 NOTE — Telephone Encounter (Signed)
 Too soon for refill, LRF 06/14/23 for 90 and 2 RF.  Requested Prescriptions  Pending Prescriptions Disp Refills   montelukast  (SINGULAIR ) 10 MG tablet 90 tablet 2    Sig: Take 1 tablet (10 mg total) by mouth at bedtime.     Pulmonology:  Leukotriene Inhibitors Passed - 10/07/2023  8:36 AM      Passed - Valid encounter within last 12 months    Recent Outpatient Visits           4 months ago Shortness of breath   [place] [facility] Duanne Butler DASEN, MD   9 months ago Chronic midline low back pain with right-sided sciatica   [place] [facility] Duanne Butler DASEN, MD   1 year ago Benign essential HTN   [place] [facility] Duanne Butler DASEN, MD   1 year ago Encounter for Medicare annual wellness exam   [place] [facility] Pickard, Butler DASEN, MD

## 2023-10-27 ENCOUNTER — Other Ambulatory Visit: Payer: Self-pay | Admitting: Family Medicine

## 2023-11-04 DIAGNOSIS — H18512 Endothelial corneal dystrophy, left eye: Secondary | ICD-10-CM | POA: Diagnosis not present

## 2023-11-04 DIAGNOSIS — H18511 Endothelial corneal dystrophy, right eye: Secondary | ICD-10-CM | POA: Diagnosis not present

## 2023-11-04 DIAGNOSIS — Z947 Corneal transplant status: Secondary | ICD-10-CM | POA: Diagnosis not present

## 2023-11-13 ENCOUNTER — Ambulatory Visit

## 2023-11-13 VITALS — Ht 73.0 in | Wt 212.0 lb

## 2023-11-13 DIAGNOSIS — Z0000 Encounter for general adult medical examination without abnormal findings: Secondary | ICD-10-CM | POA: Diagnosis not present

## 2023-11-13 NOTE — Patient Instructions (Signed)
 Mr. Pundt,  Thank you for taking the time for your Medicare Wellness Visit. I appreciate your continued commitment to your health goals. Please review the care plan we discussed, and feel free to reach out if I can assist you further.  Medicare recommends these wellness visits once per year to help you and your care team stay ahead of potential health issues. These visits are designed to focus on prevention, allowing your provider to concentrate on managing your acute and chronic conditions during your regular appointments.  Please note that Annual Wellness Visits do not include a physical exam. Some assessments may be limited, especially if the visit was conducted virtually. If needed, we may recommend a separate in-person follow-up with your provider.  Ongoing Care Seeing your primary care provider every 3 to 6 months helps us  monitor your health and provide consistent, personalized care.  Referrals If a referral was made during today's visit and you haven't received any updates within two weeks, please contact the referred provider directly to check on the status.  Recommended Screenings:  Health Maintenance  Topic Date Due   COVID-19 Vaccine (5 - 2025-26 season) 04/20/2024   Medicare Annual Wellness Visit  11/12/2024   Pneumococcal Vaccine for age over 45  Completed   Flu Shot  Completed   Zoster (Shingles) Vaccine  Completed   Meningitis B Vaccine  Aged Out   DTaP/Tdap/Td vaccine  Discontinued   Colon Cancer Screening  Discontinued       11/13/2023    2:03 PM  Advanced Directives  Does Patient Have a Medical Advance Directive? No  Would patient like information on creating a medical advance directive? Yes (MAU/Ambulatory/Procedural Areas - Information given)   Advance Care Planning is important because it: Ensures you receive medical care that aligns with your values, goals, and preferences. Provides guidance to your family and loved ones, reducing the emotional burden of  decision-making during critical moments.  Information on Advanced Care Planning can be found at [place]  Secretary of [facility] Advance Health Care Directives Advance Health Care Directives ([email or web address]   Vision: Annual vision screenings are recommended for early detection of glaucoma, cataracts, and diabetic retinopathy. These exams can also reveal signs of chronic conditions such as diabetes and high blood pressure.  Dental: Annual dental screenings help detect early signs of oral cancer, gum disease, and other conditions linked to overall health, including heart disease and diabetes.  Please see the attached documents for additional preventive care recommendations.

## 2023-11-13 NOTE — Progress Notes (Signed)
 Subjective:   Michael Nguyen is a 83 y.o. who presents for a Medicare Wellness preventive visit.  As a reminder, Annual Wellness Visits don't include a physical exam, and some assessments may be limited, especially if this visit is performed virtually. We may recommend an in-person follow-up visit with your provider if needed.  Visit Complete: Virtual I connected with  [name] on 11/13/23 by a audio enabled telemedicine application and verified that I am speaking with the correct person using two identifiers.  Patient Location: Home  Provider Location: Home Office  I discussed the limitations of evaluation and management by telemedicine. The patient expressed understanding and agreed to proceed.  Vital Signs: Because this visit was a virtual/telehealth visit, some criteria may be missing or patient reported. Any vitals not documented were not able to be obtained and vitals that have been documented are patient reported.  VideoDeclined- This patient declined Librarian, academic. Therefore the visit was completed with audio only.  Persons Participating in Visit: Patient.  AWV Questionnaire: Yes: Patient Medicare AWV questionnaire was completed by the patient on 11/07/23; I have confirmed that all information answered by patient is correct and no changes since this date.  Cardiac Risk Factors include: advanced age (>82men, >46 women);hypertension;male gender;dyslipidemia     Objective:    Today's Vitals   11/13/23 1401  Weight: 212 lb (96.2 kg)  Height: 6' 1 (1.854 m)   Body mass index is 27.97 kg/m.     11/13/2023    2:03 PM 01/30/2022   10:49 AM 01/07/2020   11:04 AM 07/04/2017    7:48 AM 10/08/2016   10:36 AM  Advanced Directives  Does Patient Have a Medical Advance Directive? No No No;Yes No  No   Would patient like information on creating a medical advance directive? Yes (MAU/Ambulatory/Procedural Areas - Information given) Yes  (MAU/Ambulatory/Procedural Areas - Information given)   No - Patient declined      Data saved with a previous flowsheet row definition    Current Medications (verified) Outpatient Encounter Medications as of 11/13/2023  Medication Sig   ALOE VERA PO Take by mouth daily.   aspirin 81 MG tablet Take 81 mg by mouth daily.   atorvastatin  (LIPITOR) 40 MG tablet Take 1 tablet by mouth once daily   celecoxib  (CELEBREX ) 200 MG capsule Take 1 capsule by mouth twice daily   Cholecalciferol (VITAMIN D3) 1000 UNITS CAPS Take 1 capsule by mouth 2 (two) times daily.   diclofenac  (VOLTAREN ) 75 MG EC tablet Take 1 tablet (75 mg total) by mouth 2 (two) times daily.   fexofenadine (ALLEGRA) 180 MG tablet Take 180 mg by mouth daily.   fish oil-omega-3 fatty acids 1000 MG capsule Take 1 g by mouth 2 (two) times daily.   fluticasone  (FLONASE ) 50 MCG/ACT nasal spray Place 2 sprays into both nostrils daily.   GLUCOSAMINE-CHONDROITIN-VIT C PO Take 2,000 mg by mouth daily.   losartan  (COZAAR ) 50 MG tablet Take 1 tablet (50 mg total) by mouth daily.   montelukast  (SINGULAIR ) 10 MG tablet Take 1 tablet (10 mg total) by mouth at bedtime.   Multiple Vitamin (MULTIVITAMIN) tablet Take 1 tablet by mouth daily.   pantoprazole  (PROTONIX ) 40 MG tablet Take 1 tablet by mouth once daily   No facility-administered encounter medications on file as of 11/13/2023.    Allergies (verified) Prednisone    History: Past Medical History:  Diagnosis Date   Allergy    Arthritis    some  per pt   BPH (benign prostatic hypertrophy)    Cancer (HCC)    skin- basal and squamous cell CA   Cataract    GERD (gastroesophageal reflux disease)    History of ETT    with dr. Alveta was normal per patient   Hyperlipidemia    Hypertension    Past Surgical History:  Procedure Laterality Date   cataracts     both eyes   COLONOSCOPY     EYE SURGERY  08/10/2020   TONSILLECTOMY     Family History  Problem Relation Age of Onset    Coronary artery disease Brother        x2. One had CABG at 29. other with CABG in his 3's. Father with MI in his 29's   Arthritis Brother    Cancer Brother    Diabetes Brother    Heart disease Brother    Hypertension Brother    Esophageal cancer Father    Arthritis Father    Cancer Father    Heart disease Father    Hypertension Father    Breast cancer Mother    Cancer Mother    Arthritis Brother    Heart disease Brother    Hypertension Brother    Colon cancer Neg Hx    Rectal cancer Neg Hx    Stomach cancer Neg Hx    Colon polyps Neg Hx    Social History   Socioeconomic History   Marital status: Married    Spouse name: Not on file   Number of children: 2   Years of education: Not on file   Highest education level: GED or equivalent  Occupational History   Occupation: Curator  Tobacco Use   Smoking status: Former    Current packs/day: 0.00    Average packs/day: 1.5 packs/day for 10.0 years (15.0 ttl pk-yrs)    Types: Cigarettes    Quit date: 01/23/1967    Years since quitting: 56.8   Smokeless tobacco: Never  Vaping Use   Vaping status: Never Used  Substance and Sexual Activity   Alcohol use: No   Drug use: No   Sexual activity: Not Currently  Other Topics Concern   Not on file  Social History Narrative   Lives in [place]   Social Drivers of Health   Financial Resource Strain: Low Risk  (11/07/2023)   Overall Financial Resource Strain (CARDIA)    Difficulty of Paying Living Expenses: Not hard at all  Food Insecurity: No Food Insecurity (11/07/2023)   Hunger Vital Sign    Worried About Running Out of Food in the Last Year: Never true    Ran Out of Food in the Last Year: Never true  Transportation Needs: No Transportation Needs (11/07/2023)   PRAPARE - Administrator, Civil Service (Medical): No    Lack of Transportation (Non-Medical): No  Physical Activity: Sufficiently Active (11/07/2023)   Exercise Vital Sign    Days of Exercise per  Week: 1 day    Minutes of Exercise per Session: 150+ min  Stress: No Stress Concern Present (11/07/2023)   [employer] of Occupational Health - Occupational Stress Questionnaire    Feeling of Stress: Not at all  Social Connections: Moderately Integrated (11/07/2023)   Social Connection and Isolation Panel    Frequency of Communication with Friends and Family: Once a week    Frequency of Social Gatherings with Friends and Family: Once a week    Attends Religious Services: More than 4 times  per year    Active Member of Clubs or Organizations: Yes    Attends Banker Meetings: More than 4 times per year    Marital Status: Married    Tobacco Counseling Counseling given: Not Answered    Clinical Intake:  Pre-visit preparation completed: Yes  Pain : No/denies pain  Diabetes: No   How often do you need to have someone help you when you read instructions, pamphlets, or other written materials from your doctor or pharmacy?: 1 - Never  Interpreter Needed?: No  Information entered by :: Charmaine Bloodgood LPN   Activities of Daily Living     11/13/2023    2:02 PM  In your present state of health, do you have any difficulty performing the following activities:  Hearing? 0  Vision? 0  Difficulty concentrating or making decisions? 0  Walking or climbing stairs? 0  Dressing or bathing? 0  Doing errands, shopping? 0  Preparing Food and eating ? N  Using the Toilet? N  In the past six months, have you accidently leaked urine? N  Do you have problems with loss of bowel control? N  Managing your Medications? N  Managing your Finances? N  Housekeeping or managing your Housekeeping? N    Patient Care Team: Duanne Butler DASEN, MD as PCP - General (Family Medicine) Delmonte, Carliss ORN, MD as Consulting Physician (Ophthalmology) Gaston Hamilton, MD as Consulting Physician (Urology) Lynnell Nottingham, MD as Consulting Physician (Dermatology)  I have updated your Care  Teams any recent Medical Services you may have received from other providers in the past year.     Assessment:   This is a routine wellness examination for Sim.  Hearing/Vision screen Hearing Screening - Comments:: Patient is able to hear conversational tones without difficulty. No issues reported.   Vision Screening - Comments:: Wears rx glasses - up to date with routine [facility] exams [facility]    Goals Addressed             This Visit's Progress    Remain active and independent   On track      Depression Screen     11/13/2023    2:03 PM 06/07/2023   10:23 AM 01/07/2023   12:08 PM 08/03/2022   12:15 PM 02/15/2022   11:09 AM 02/15/2022   11:02 AM 01/30/2022   10:48 AM  PHQ 2/9 Scores  PHQ - 2 Score 0 0 0 0 0 0 0    Fall Risk     11/13/2023    2:04 PM 06/07/2023   10:23 AM 01/07/2023   12:07 PM 08/03/2022   12:15 PM 02/15/2022   11:09 AM  Fall Risk   Falls in the past year? 0 0 0 0 0  Number falls in past yr: 0 0 0 0 0  Injury with Fall? 0 0 0 0 0  Risk for fall due to : No Fall Risks No Fall Risks  No Fall Risks No Fall Risks  Follow up Falls prevention discussed;Education provided;Falls evaluation completed Falls prevention discussed;Falls evaluation completed  Falls prevention discussed Falls prevention discussed      Data saved with a previous flowsheet row definition    MEDICARE RISK AT HOME:  Medicare Risk at Home Any stairs in or around the home?: No If so, are there any without handrails?: No Home free of loose throw rugs in walkways, pet beds, electrical cords, etc?: Yes Adequate lighting in your home to reduce risk  of falls?: Yes Life alert?: No Use of a cane, walker or w/c?: No Grab bars in the bathroom?: Yes Shower chair or bench in shower?: No Elevated toilet seat or a handicapped toilet?: Yes  TIMED UP AND GO:  Was the test performed?  No  Cognitive Function: 6CIT completed        11/13/2023    2:04  PM 01/30/2022   10:26 AM 01/07/2020   11:05 AM  6CIT Screen  What Year? 0 points 0 points 0 points  What month? 0 points 0 points 0 points  What time? 0 points 0 points 0 points  Count back from 20 0 points 0 points 0 points  Months in reverse 0 points 0 points 0 points  Repeat phrase 0 points 0 points 0 points  Total Score 0 points 0 points 0 points    Immunizations Immunization History  Administered Date(s) Administered   Fluad Quad(high Dose 65+) 10/24/2018, 11/05/2019, 10/18/2020   INFLUENZA, HIGH DOSE SEASONAL PF 11/01/2017   Influenza,inj,Quad PF,6+ Mos 10/15/2012, 10/20/2013, 09/28/2014, 10/08/2016   Influenza-Unspecified 11/02/2021, 10/30/2022, 10/22/2023   Moderna SARS-COV2 Booster Vaccination 10/30/2022   Pfizer Covid-19 Vaccine Bivalent Booster 58yrs & up 11/14/2020   Pfizer(Comirnaty)Fall Seasonal Vaccine 12 years and older 11/02/2021, 10/22/2023   Pneumococcal Conjugate-13 09/14/2013   Pneumococcal Polysaccharide-23 11/06/2005   Respiratory Syncytial Virus Vaccine,Recomb Aduvanted(Arexvy) 10/22/2023   Td 10/03/2004   Tdap 02/15/2022   Zoster Recombinant(Shingrix) 02/15/2021, 04/17/2021   Zoster, Live 12/03/2006    Screening Tests Health Maintenance  Topic Date Due   COVID-19 Vaccine (5 - 2025-26 season) 04/20/2024   Medicare Annual Wellness (AWV)  11/12/2024   Pneumococcal Vaccine: 50+ Years  Completed   Influenza Vaccine  Completed   Zoster Vaccines- Shingrix  Completed   Meningococcal B Vaccine  Aged Out   DTaP/Tdap/Td  Discontinued   Colonoscopy  Discontinued    Health Maintenance Items Addressed: Patient is up to date   Additional Screening:  Vision Screening: Recommended annual ophthalmology exams for early detection of glaucoma and other disorders of the eye. Is the patient up to date with their annual eye exam?  Yes  Who is the provider or what is the name of the office in which the patient attends annual eye exams? [facility]   Dental Screening: Recommended annual dental exams for proper oral hygiene  Community Resource Referral / Chronic Care Management: CRR required this visit?  No   CCM required this visit?  No   Plan:    I have personally reviewed and noted the following in the patient's chart:   Medical and social history Use of alcohol, tobacco or illicit drugs  Current medications and supplements including opioid prescriptions. Patient is not currently taking opioid prescriptions. Functional ability and status Nutritional status Physical activity Advanced directives List of other physicians Hospitalizations, surgeries, and ER visits in previous 12 months Vitals Screenings to include cognitive, depression, and falls Referrals and appointments  In addition, I have reviewed and discussed with patient certain preventive protocols, quality metrics, and best practice recommendations. A written personalized care plan for preventive services as well as general preventive health recommendations were provided to patient.   Lavelle Pfeiffer [place], [place]   [id number]   After Visit Summary: (MyChart) Due to this being a telephonic visit, the after visit summary with patients personalized plan was offered to patient via MyChart   Notes: Nothing significant to report at this time.

## 2023-11-22 ENCOUNTER — Other Ambulatory Visit: Payer: Self-pay | Admitting: Family Medicine

## 2023-11-26 ENCOUNTER — Other Ambulatory Visit: Payer: Self-pay | Admitting: Family Medicine

## 2023-12-23 ENCOUNTER — Other Ambulatory Visit: Payer: Self-pay | Admitting: Family Medicine

## 2024-01-21 ENCOUNTER — Other Ambulatory Visit: Payer: Self-pay | Admitting: Family Medicine

## 2024-02-11 ENCOUNTER — Other Ambulatory Visit: Payer: Self-pay | Admitting: Family Medicine

## 2024-02-11 ENCOUNTER — Other Ambulatory Visit: Payer: Self-pay

## 2024-02-11 DIAGNOSIS — I10 Essential (primary) hypertension: Secondary | ICD-10-CM

## 2024-02-11 MED ORDER — LOSARTAN POTASSIUM 50 MG PO TABS: 50.0000 mg | ORAL_TABLET | Freq: Every day | ORAL | 3 refills | Status: AC

## 2024-02-20 ENCOUNTER — Encounter: Payer: Self-pay | Admitting: Family Medicine

## 2024-02-20 ENCOUNTER — Other Ambulatory Visit: Payer: Self-pay | Admitting: Family Medicine

## 2024-02-20 ENCOUNTER — Ambulatory Visit: Admitting: Family Medicine

## 2024-02-20 VITALS — BP 138/72 | HR 84 | Temp 97.8°F | Ht 73.0 in | Wt 216.4 lb

## 2024-02-20 DIAGNOSIS — J40 Bronchitis, not specified as acute or chronic: Secondary | ICD-10-CM

## 2024-02-20 MED ORDER — HYDROCODONE BIT-HOMATROP MBR 5-1.5 MG/5ML PO SOLN: 5.0000 mL | Freq: Three times a day (TID) | ORAL | 0 refills | Status: AC | PRN

## 2024-02-20 MED ORDER — AZITHROMYCIN 250 MG PO TABS: ORAL_TABLET | ORAL | 0 refills | Status: AC

## 2024-02-20 NOTE — Progress Notes (Signed)
 "  Subjective:    Patient ID: Michael Nguyen, male    DOB: 1940-12-10, 84 y.o.   MRN: [id number]  Cough  Patient has been coughing for 2 weeks.  Cough is productive of green sputum.  He also reports chest congestion.  The cough is keeping him awake all night long.  At times the cough is so bad he cannot stop coughing.  He denies any fevers or chills.  He denies any shortness of breath.  He denies any rhinorrhea or sinus pain. Past Medical History:  Diagnosis Date   Allergy    Arthritis    some per pt   BPH (benign prostatic hypertrophy)    Cancer (HCC)    skin- basal and squamous cell CA   Cataract    GERD (gastroesophageal reflux disease)    History of ETT    with dr. Alveta was normal per patient   Hyperlipidemia    Hypertension    Past Surgical History:  Procedure Laterality Date   cataracts     both eyes   COLONOSCOPY     EYE SURGERY  08/10/2020   TONSILLECTOMY     Current Outpatient Medications on File Prior to Visit  Medication Sig Dispense Refill   ALOE VERA PO Take by mouth daily.     aspirin 81 MG tablet Take 81 mg by mouth daily.     atorvastatin  (LIPITOR) 40 MG tablet Take 1 tablet by mouth once daily 90 tablet 0   celecoxib  (CELEBREX ) 200 MG capsule Take 1 capsule by mouth twice daily 60 capsule 0   Cholecalciferol (VITAMIN D3) 1000 UNITS CAPS Take 1 capsule by mouth 2 (two) times daily.     fexofenadine (ALLEGRA) 180 MG tablet Take 180 mg by mouth daily.     fish oil-omega-3 fatty acids 1000 MG capsule Take 1 g by mouth 2 (two) times daily.     fluticasone  (FLONASE ) 50 MCG/ACT nasal spray Place 2 sprays into both nostrils daily.     GLUCOSAMINE-CHONDROITIN-VIT C PO Take 2,000 mg by mouth daily.     losartan  (COZAAR ) 50 MG tablet Take 1 tablet (50 mg total) by mouth daily. 90 tablet 3   montelukast  (SINGULAIR ) 10 MG tablet Take 1 tablet (10 mg total) by mouth at bedtime. 90 tablet 2   Multiple Vitamin (MULTIVITAMIN) tablet Take 1 tablet  by mouth daily.     pantoprazole  (PROTONIX ) 40 MG tablet Take 1 tablet by mouth once daily 90 tablet 0   Turmeric (QC TUMERIC COMPLEX) 500 MG CAPS      No current facility-administered medications on file prior to visit.   Allergies  Allergen Reactions   Prednisone      REACTION: ? intolerance per patient / Cramps   Social History   Socioeconomic History   Marital status: Married    Spouse name: Not on file   Number of children: 2   Years of education: Not on file   Highest education level: GED or equivalent  Occupational History   Occupation: Curator  Tobacco Use   Smoking status: Former    Current packs/day: 0.00    Average packs/day: 1.5 packs/day for 10.0 years (15.0 ttl pk-yrs)    Types: Cigarettes    Quit date: 01/23/1967    Years since quitting: 57.1   Smokeless tobacco: Never  Vaping Use   Vaping status: Never Used  Substance and Sexual Activity   Alcohol use: No   Drug use: No   Sexual activity: Not Currently  Other Topics Concern   Not on file  Social History Narrative   Lives in [place]   Social Drivers of Health   Tobacco Use: Medium Risk (02/20/2024)   Patient History    Smoking Tobacco Use: Former    Smokeless Tobacco Use: Never    Passive Exposure: Not on file  Financial Resource Strain: Low Risk (11/07/2023)   Overall Financial Resource Strain (CARDIA)    Difficulty of Paying Living Expenses: Not hard at all  Food Insecurity: No Food Insecurity (11/07/2023)   Epic    Worried About Programme Researcher, Broadcasting/film/video in the Last Year: Never true    Ran Out of Food in the Last Year: Never true  Transportation Needs: No Transportation Needs (11/07/2023)   Epic    Lack of Transportation (Medical): No    Lack of Transportation (Non-Medical): No  Physical Activity: Sufficiently Active (11/07/2023)   Exercise Vital Sign    Days of Exercise per Week: 1 day    Minutes of Exercise per Session: 150+ min  Stress: No Stress Concern Present  (11/07/2023)   [employer] of Occupational Health - Occupational Stress Questionnaire    Feeling of Stress: Not at all  Social Connections: Moderately Integrated (11/07/2023)   Social Connection and Isolation Panel    Frequency of Communication with Friends and Family: Once a week    Frequency of Social Gatherings with Friends and Family: Once a week    Attends Religious Services: More than 4 times per year    Active Member of [employer] or Organizations: Yes    Attends Banker Meetings: More than 4 times per year    Marital Status: Married  Catering Manager Violence: Not At Risk (11/13/2023)   Epic    Fear of Current or Ex-Partner: No    Emotionally Abused: No    Physically Abused: No    Sexually Abused: No  Depression (PHQ2-9): Low Risk (11/13/2023)   Depression (PHQ2-9)    PHQ-2 Score: 0  Alcohol Screen: Low Risk (11/07/2023)   Alcohol Screen    Last Alcohol Screening Score (AUDIT): 0  Housing: Low Risk (11/07/2023)   Epic    Unable to Pay for Housing in the Last Year: No    Number of Times Moved in the Last Year: 0    Homeless in the Last Year: No  Utilities: Not At Risk (11/13/2023)   Epic    Threatened with loss of utilities: No  Health Literacy: Adequate Health Literacy (11/13/2023)   B1300 Health Literacy    Frequency of need for help with medical instructions: Never   Family History  Problem Relation Age of Onset   Coronary artery disease Brother        x2. One had CABG at 63. other with CABG in his 86's. Father with MI in his 33's   Arthritis Brother    Cancer Brother    Diabetes Brother    Heart disease Brother    Hypertension Brother    Esophageal cancer Father    Arthritis Father    Cancer Father    Heart disease Father    Hypertension Father    Breast cancer Mother    Cancer Mother    Arthritis Brother    Heart disease Brother    Hypertension Brother    Colon cancer Neg Hx    Rectal cancer Neg Hx     Stomach cancer Neg Hx    Colon polyps Neg Hx  Review of Systems  Respiratory:  Positive for cough.   All other systems reviewed and are negative.      Objective:   Physical Exam Vitals reviewed.  Constitutional:      General: He is not in acute distress.    Appearance: He is well-developed. He is not diaphoretic.  HENT:     Head: Normocephalic and atraumatic.     Right Ear: External ear normal.     Left Ear: External ear normal.     Nose: Nose normal.     Mouth/Throat:     Pharynx: No oropharyngeal exudate.  Eyes:     General: No scleral icterus.       Right eye: No discharge.        Left eye: No discharge.     Conjunctiva/sclera: Conjunctivae normal.     Pupils: Pupils are equal, round, and reactive to light.  Neck:     Thyroid: No thyromegaly.     Vascular: No JVD.     Trachea: No tracheal deviation.  Cardiovascular:     Rate and Rhythm: Normal rate and regular rhythm.     Heart sounds: Normal heart sounds. No murmur heard.    No friction rub. No gallop.  Pulmonary:     Effort: Pulmonary effort is normal. No respiratory distress.     Breath sounds: Normal breath sounds. No stridor. No wheezing or rales.  Chest:     Chest wall: No tenderness.  Abdominal:     General: Bowel sounds are normal. There is no distension.     Palpations: Abdomen is soft. There is no mass.     Tenderness: There is no abdominal tenderness. There is no guarding or rebound.  Genitourinary:    Prostate: Not enlarged and not tender.     Rectum: Normal.  Musculoskeletal:        General: No tenderness. Normal range of motion.     Cervical back: Neck supple.     Right lower leg: No edema.     Left lower leg: No edema.  Lymphadenopathy:     Cervical: No cervical adenopathy.  Skin:    General: Skin is warm.     Coloration: Skin is not pale.     Findings: No erythema or rash.  Neurological:     Mental Status: He is alert and oriented to person, place, and time.     Cranial  Nerves: No cranial nerve deficit.     Motor: No abnormal muscle tone.     Coordination: Coordination normal.     Deep Tendon Reflexes: Reflexes are normal and symmetric.  Psychiatric:        Behavior: Behavior normal.        Thought Content: Thought content normal.        Judgment: Judgment normal.        Assessment & Plan:  Bronchitis Feel the patient has bronchitis.  Begin a Z-Pak.  Use Hycodan 1 teaspoon every 6 hours as needed for coughing "

## 2024-02-21 ENCOUNTER — Other Ambulatory Visit: Payer: Self-pay | Admitting: Family Medicine

## 2024-06-05 ENCOUNTER — Other Ambulatory Visit

## 2024-06-11 ENCOUNTER — Encounter: Admitting: Family Medicine

## 2024-11-18 ENCOUNTER — Ambulatory Visit
